# Patient Record
Sex: Female | Born: 1970 | Race: White | Hispanic: No | Marital: Married | Smoking: Never smoker
Health system: Southern US, Community
[De-identification: ages and names within clinical notes are randomized; demographics above are authoritative.]

## PROBLEM LIST (undated history)

## (undated) DIAGNOSIS — E559 Vitamin D deficiency, unspecified: Secondary | ICD-10-CM

## (undated) DIAGNOSIS — E785 Hyperlipidemia, unspecified: Secondary | ICD-10-CM

## (undated) DIAGNOSIS — I10 Essential (primary) hypertension: Secondary | ICD-10-CM

## (undated) DIAGNOSIS — F419 Anxiety disorder, unspecified: Secondary | ICD-10-CM

## (undated) DIAGNOSIS — F411 Generalized anxiety disorder: Secondary | ICD-10-CM

## (undated) DIAGNOSIS — M549 Dorsalgia, unspecified: Secondary | ICD-10-CM

## (undated) DIAGNOSIS — K635 Polyp of colon: Secondary | ICD-10-CM

## (undated) DIAGNOSIS — F32A Depression, unspecified: Secondary | ICD-10-CM

## (undated) HISTORY — DX: Hyperlipidemia, unspecified: E78.5

## (undated) HISTORY — DX: Dorsalgia, unspecified: M54.9

## (undated) HISTORY — DX: Vitamin D deficiency, unspecified: E55.9

## (undated) HISTORY — DX: Anxiety disorder, unspecified: F41.9

## (undated) HISTORY — PX: LAPAROSCOPIC GASTRIC SLEEVE RESECTION: SHX5895

## (undated) HISTORY — DX: Polyp of colon: K63.5

## (undated) HISTORY — PX: ABLATION: SHX5711

## (undated) HISTORY — DX: Depression, unspecified: F32.A

## (undated) HISTORY — DX: Generalized anxiety disorder: F41.1

## (undated) HISTORY — PX: HEEL SPUR RESECTION: SHX6410

---

## 2011-08-15 DIAGNOSIS — L821 Other seborrheic keratosis: Secondary | ICD-10-CM | POA: Insufficient documentation

## 2011-08-15 DIAGNOSIS — D235 Other benign neoplasm of skin of trunk: Secondary | ICD-10-CM | POA: Insufficient documentation

## 2011-09-03 DIAGNOSIS — I83819 Varicose veins of unspecified lower extremities with pain: Secondary | ICD-10-CM | POA: Insufficient documentation

## 2012-03-02 DIAGNOSIS — F32A Depression, unspecified: Secondary | ICD-10-CM | POA: Insufficient documentation

## 2012-03-02 DIAGNOSIS — I10 Essential (primary) hypertension: Secondary | ICD-10-CM | POA: Insufficient documentation

## 2013-12-21 DIAGNOSIS — Z9884 Bariatric surgery status: Secondary | ICD-10-CM | POA: Insufficient documentation

## 2015-09-28 ENCOUNTER — Emergency Department (INDEPENDENT_AMBULATORY_CARE_PROVIDER_SITE_OTHER)
Admission: EM | Admit: 2015-09-28 | Discharge: 2015-09-28 | Disposition: A | Discharge status: Home / Self Care | Payer: BLUE CROSS/BLUE SHIELD | Source: Home / Self Care | Attending: Emergency Medicine | Admitting: Emergency Medicine

## 2015-09-28 ENCOUNTER — Encounter: Payer: Self-pay | Admitting: *Deleted

## 2015-09-28 DIAGNOSIS — H66001 Acute suppurative otitis media without spontaneous rupture of ear drum, right ear: Secondary | ICD-10-CM

## 2015-09-28 HISTORY — DX: Essential (primary) hypertension: I10

## 2015-09-28 MED ORDER — ANTIPYRINE-BENZOCAINE 5.4-1.4 % OT SOLN: 3.0000 [drp] | OTIC | 0 refills | Status: DC | PRN

## 2015-09-28 MED ORDER — IBUPROFEN 200 MG PO TABS: ORAL_TABLET | ORAL | 0 refills | Status: DC

## 2015-09-28 MED ORDER — CEFDINIR 300 MG PO CAPS: 300.0000 mg | ORAL_CAPSULE | Freq: Two times a day (BID) | ORAL | 0 refills | Status: DC

## 2015-09-28 NOTE — ED Triage Notes (Signed)
Pt reports being on Azithromycin x 2-3 days for bronchitis. Today about 2 hours ago developed sudden right ear pain without drainage. Gave 600 mgIBF.

## 2015-09-30 NOTE — ED Provider Notes (Signed)
[facility]    CSN: [id number] Arrival date & time: 09/28/15  1449  First Provider Contact:  None       History   Chief Complaint Chief Complaint  Patient presents with  . Otalgia    HPI Rachael Young is a 45 y.o. female.   HPI 5 days of sinus congestion and mild earache and a cough occasionally productive of slight yellow sputum. She saw her doctor 4 days ago who prescribed Zithromax. She feels the cough has resolved but now complains of severe right earache for 1 day. The right ear pain is sharp and dull. Denies any associated draining or blood from the ear. Denies fever or chills or nausea or vomiting.  Here in urgent care, gave 600 mg ibuprofen by mouth stat, and pain improved somewhat 20 minutes later. Currently denies any chest pain or shortness of breath or cough. She denies chance of pregnancy.  Past Medical History:  Diagnosis Date  . Hypertension     There are no active problems to display for this patient.   Past Surgical History:  Procedure Laterality Date  . CESAREAN SECTION    . LAPAROSCOPIC GASTRIC SLEEVE RESECTION      OB History    No data available       Home Medications    Prior to Admission medications   Medication Sig Start Date End Date Taking? Authorizing Provider  antipyrine-benzocaine Lyla Son) otic solution Place 3-4 drops into the right ear every 2 (two) hours as needed for ear pain. 09/28/15   Lajean Manes, MD  cefdinir (OMNICEF) 300 MG capsule Take 1 capsule (300 mg total) by mouth 2 (two) times daily. X 10 days 09/28/15   Lajean Manes, MD  ibuprofen (ADVIL,MOTRIN) 200 MG tablet Take three tablets ( 600 milligrams total) every 6 with food as needed for pain. 09/28/15   Lajean Manes, MD    Family History Family History  Problem Relation Age of Onset  . Hypertension Mother   . Hypertension Father     Social History Social History  Substance Use Topics  . Smoking status: Never Smoker  . Smokeless tobacco: Never Used    . Alcohol use No     Allergies   Review of patient's allergies indicates no known allergies.   Review of Systems Review of Systems  All other systems reviewed and are negative.    Physical Exam Triage Vital Signs ED Triage Vitals  Enc Vitals Group     BP 09/28/15 1544 (!) 156/107     Pulse Rate 09/28/15 1544 92     Resp 09/28/15 1544 14     Temp 09/28/15 1544 98.2 F (36.8 C)     Temp Source 09/28/15 1544 Oral     SpO2 09/28/15 1544 100 %     Weight 09/28/15 1544 208 lb (94.3 kg)     Height 09/28/15 1544  (1.575 m)     Head Circumference --      Peak Flow --      Pain Score 09/28/15 1546 8     Pain Loc --      Pain Edu? --      Excl. in GC? --    No data found.   Updated Vital Signs BP (!) 156/107 (BP Location: Left Arm)   Pulse 92   Temp 98.2 F (36.8 C) (Oral)   Resp 14   Ht  (1.575 m)   Wt 208 lb (94.3 kg)   LMP 08/10/2015  SpO2 100%   BMI 38.04 kg/m   Visual Acuity Right Eye Distance:   Left Eye Distance:   Bilateral Distance:    Right Eye Near:   Left Eye Near:    Bilateral Near:     Physical Exam  Constitutional: She is oriented to person, place, and time. She appears well-developed and well-nourished. No distress.  HENT:  Head: Normocephalic and atraumatic.  Right Ear: External ear and ear canal normal. Tympanic membrane is injected and erythematous.  Left Ear: Tympanic membrane, external ear and ear canal normal.  Nose: Mucosal edema and rhinorrhea present.  Mouth/Throat: Oropharynx is clear and moist. No oral lesions.  Eyes: Conjunctivae are normal. No scleral icterus.  Neck: Neck supple.  Cardiovascular: Normal rate, regular rhythm and normal heart sounds.   Pulmonary/Chest: Effort normal and breath sounds normal.  Lymphadenopathy:    She has no cervical adenopathy.  Neurological: She is alert and oriented to person, place, and time.  Skin: Skin is warm and dry.  Psychiatric: She has a normal mood and affect.  Nursing  note and vitals reviewed.    UC Treatments / Results  Labs (all labs ordered are listed, but only abnormal results are displayed) Labs Reviewed - No data to display  EKG  EKG Interpretation None       Radiology No results found.  Procedures Procedures (including critical care time)  Medications Ordered in UC Medications - No data to display   Initial Impression / Assessment and Plan / UC Course  I have reviewed the triage vital signs and the nursing notes.  Pertinent labs & imaging results that were available during my care of the patient were reviewed by me and considered in my medical decision making (see chart for details).  Clinical Course    Final Clinical Impressions(s) / UC Diagnoses   Final diagnoses:  Acute suppurative otitis media of right ear without spontaneous rupture of tympanic membrane, recurrence not specified   Treatment options discussed, as well as risks, benefits, alternatives. Patient voiced understanding and agreement with the following plans:  New Prescriptions Discharge Medication List as of 09/28/2015  4:00 PM    START taking these medications   Details  antipyrine-benzocaine (AURALGAN) otic solution Place 3-4 drops into the right ear every 2 (two) hours as needed for ear pain., Starting Fri 09/28/2015, Normal    cefdinir (OMNICEF) 300 MG capsule Take 1 capsule (300 mg total) by mouth 2 (two) times daily. X 10 days, Starting Fri 09/28/2015, Normal    ibuprofen (ADVIL,MOTRIN) 200 MG tablet Take three tablets ( 600 milligrams total) every 6 with food as needed for pain., No Print      Other symptomatic care discussed Follow-up with your primary care doctor in 5-7 days if not improving, or sooner if symptoms become worse. Precautions discussed. Red flags discussed. Questions invited and answered. Patient voiced understanding and agreement.    Lajean Manesavid Massey, MD 09/30/15 [phone]

## 2015-10-12 DIAGNOSIS — H6120 Impacted cerumen, unspecified ear: Secondary | ICD-10-CM | POA: Insufficient documentation

## 2015-10-12 DIAGNOSIS — H9313 Tinnitus, bilateral: Secondary | ICD-10-CM | POA: Insufficient documentation

## 2015-10-12 DIAGNOSIS — H903 Sensorineural hearing loss, bilateral: Secondary | ICD-10-CM | POA: Insufficient documentation

## 2017-01-12 ENCOUNTER — Emergency Department (INDEPENDENT_AMBULATORY_CARE_PROVIDER_SITE_OTHER): Payer: BLUE CROSS/BLUE SHIELD

## 2017-01-12 ENCOUNTER — Other Ambulatory Visit: Payer: Self-pay

## 2017-01-12 ENCOUNTER — Encounter: Payer: Self-pay | Admitting: Emergency Medicine

## 2017-01-12 ENCOUNTER — Emergency Department (INDEPENDENT_AMBULATORY_CARE_PROVIDER_SITE_OTHER)
Admission: EM | Admit: 2017-01-12 | Discharge: 2017-01-12 | Disposition: A | Discharge status: Home / Self Care | Payer: BLUE CROSS/BLUE SHIELD | Source: Home / Self Care | Attending: Family Medicine | Admitting: Family Medicine

## 2017-01-12 DIAGNOSIS — M25572 Pain in left ankle and joints of left foot: Secondary | ICD-10-CM

## 2017-01-12 DIAGNOSIS — M779 Enthesopathy, unspecified: Secondary | ICD-10-CM

## 2017-01-12 DIAGNOSIS — M778 Other enthesopathies, not elsewhere classified: Secondary | ICD-10-CM

## 2017-01-12 MED ORDER — PREDNISONE 20 MG PO TABS
ORAL_TABLET | ORAL | 0 refills | Status: DC
Start: 2017-01-12 — End: 2017-03-10

## 2017-01-12 NOTE — ED Provider Notes (Signed)
[facility]    CSN: [id number] Arrival date & time: 01/12/17  1148     History   Chief Complaint Chief Complaint  Patient presents with  . Foot Pain    HPI Rachael Young is a 46 y.o. female.   Patient complains of intermittent pain in her left ankle/foot for about 6 months, becoming worse during the past 3 months.  She recalls no injury, and denies changes in activities.  She has had intermittent swelling in her foot.   The history is provided by the patient.  Foot Pain  This is a chronic problem. Episode onset: 6 months. The problem occurs daily. The problem has been gradually worsening. The symptoms are aggravated by walking. Nothing relieves the symptoms. She has tried nothing for the symptoms.    Past Medical History:  Diagnosis Date  . Hypertension     There are no active problems to display for this patient.   Past Surgical History:  Procedure Laterality Date  . CESAREAN SECTION    . LAPAROSCOPIC GASTRIC SLEEVE RESECTION      OB History    No data available       Home Medications    Prior to Admission medications   Medication Sig Start Date End Date Taking? Authorizing Provider  FLUoxetine (PROZAC) 20 MG capsule Take 20 mg daily by mouth.   Yes [provider]  hydrochlorothiazide (MICROZIDE) 12.5 MG capsule Take 12.5 mg daily by mouth.   Yes [provider]  ibuprofen (ADVIL,MOTRIN) 200 MG tablet Take three tablets ( 600 milligrams total) every 6 with food as needed for pain. 09/28/15   Lajean ManesMassey, David, MD  predniSONE (DELTASONE) 20 MG tablet Take one tab by mouth twice daily for 4 days, then one daily for 3 days. Take with food. 01/12/17   Lattie HawBeese, Tresa Jolley A, MD    Family History Family History  Problem Relation Age of Onset  . Hypertension Mother   . Hypertension Father     Social History Social History   Tobacco Use  . Smoking status: Never Smoker  . Smokeless tobacco: Never Used  Substance Use Topics  . Alcohol  use: No  . Drug use: No     Allergies   Patient has no known allergies.   Review of Systems Review of Systems  All other systems reviewed and are negative.    Physical Exam Triage Vital Signs ED Triage Vitals  Enc Vitals Group     BP 01/12/17 1229 (!) 142/89     Pulse Rate 01/12/17 1229 71     Resp --      Temp 01/12/17 1229 98.6 F (37 C)     Temp Source 01/12/17 1229 Oral     SpO2 01/12/17 1229 97 %     Weight 01/12/17 1230 226 lb (102.5 kg)     Height 01/12/17 1230 5\' 3"  (1.6 m)     Head Circumference --      Peak Flow --      Pain Score 01/12/17 1230 5     Pain Loc --      Pain Edu? --      Excl. in GC? --    No data found.  Updated Vital Signs BP (!) 142/89 (BP Location: Right Arm)   Pulse 71   Temp 98.6 F (37 C) (Oral)   Ht 5\' 3"  (1.6 m)   Wt 226 lb (102.5 kg)   SpO2 97%   BMI 40.03 kg/m   Visual  Acuity Right Eye Distance:   Left Eye Distance:   Bilateral Distance:    Right Eye Near:   Left Eye Near:    Bilateral Near:     Physical Exam  Constitutional: She appears well-developed and well-nourished. No distress.  HENT:  Head: Normocephalic.  Eyes: Pupils are equal, round, and reactive to light.  Cardiovascular: Normal rate.  Pulmonary/Chest: Effort normal.  Musculoskeletal:       Left foot: There is tenderness and swelling. There is no bony tenderness, normal capillary refill, no crepitus and no deformity.       Feet:  Left foot reveals distinct tenderness over extensor tendons as noted on diagram.   Neurological: She is alert.  Skin: Skin is warm and dry.  Nursing note and vitals reviewed.    UC Treatments / Results  Labs (all labs ordered are listed, but only abnormal results are displayed) Labs Reviewed - No data to display  EKG  EKG Interpretation None       Radiology Dg Ankle Complete Left  Result Date: 01/12/2017 CLINICAL DATA:  Pain across LEFT ankle and to top of LEFT foot since decorating yesterday EXAM: LEFT  ANKLE COMPLETE - 3+ VIEW COMPARISON:  None FINDINGS: Osseous mineralization normal for technique. Joint spaces preserved. Small plantar calcaneal spur. No acute fracture, dislocation, or bone destruction. Question mild anterior soft tissue swelling at ankle. IMPRESSION: No acute osseous abnormalities. Electronically Signed   By: Ulyses SouthwardMark  Boles M.D.   On: 01/12/2017 13:00   Dg Foot Complete Left  Result Date: 01/12/2017 CLINICAL DATA:  Pain across LEFT ankle and to top of LEFT foot since decorating yesterday EXAM: LEFT FOOT - COMPLETE 3+ VIEW COMPARISON:  None. FINDINGS: Osseous mineralization normal for technique. Joint spaces preserved. Small plantar calcaneal spur. No acute fracture, dislocation or bone destruction. IMPRESSION: No acute osseous abnormalities. Electronically Signed   By: Ulyses SouthwardMark  Boles M.D.   On: 01/12/2017 12:57    Procedures Procedures (including critical care time)  Medications Ordered in UC Medications - No data to display   Initial Impression / Assessment and Plan / UC Course  I have reviewed the triage vital signs and the nursing notes.  Pertinent labs & imaging results that were available during my care of the patient were reviewed by me and considered in my medical decision making (see chart for details).    Begin prednisone burst/taper. Apply ice pack for 20 minutes, 3 to 4 times daily  Continue until pain and swelling decrease.  Begin calf stretching exercises. Followup with Dr. Rodney Langtonhomas Thekkekandam or Dr. Clementeen GrahamEvan [facility] ([facility]) if not improving about three weeks     Final Clinical Impressions(s) / UC Diagnoses   Final diagnoses:  Pain of joint of left ankle and foot  Extensor tendonitis of foot    ED Discharge Orders        Ordered    predniSONE (DELTASONE) 20 MG tablet     01/12/17 1324          Lattie HawBeese, Beckhem Isadore A, MD 01/15/17 1344

## 2017-01-12 NOTE — Discharge Instructions (Signed)
Apply ice pack for 20 minutes, 3 to 4 times daily  Continue until pain and swelling decrease.  Begin calf stretching exercises.

## 2017-01-12 NOTE — ED Triage Notes (Signed)
Left foot/ankle pain x 6 months, intermittent swelling and pain 5-8

## 2017-03-10 ENCOUNTER — Emergency Department (INDEPENDENT_AMBULATORY_CARE_PROVIDER_SITE_OTHER)
Admission: EM | Admit: 2017-03-10 | Discharge: 2017-03-10 | Disposition: A | Discharge status: Home / Self Care | Payer: BLUE CROSS/BLUE SHIELD | Source: Home / Self Care | Attending: Family Medicine | Admitting: Family Medicine

## 2017-03-10 ENCOUNTER — Other Ambulatory Visit: Payer: Self-pay

## 2017-03-10 ENCOUNTER — Encounter: Payer: Self-pay | Admitting: *Deleted

## 2017-03-10 DIAGNOSIS — J40 Bronchitis, not specified as acute or chronic: Secondary | ICD-10-CM | POA: Diagnosis not present

## 2017-03-10 MED ORDER — DOXYCYCLINE HYCLATE 100 MG PO CAPS: 100.0000 mg | ORAL_CAPSULE | Freq: Two times a day (BID) | ORAL | 0 refills | Status: DC

## 2017-03-10 NOTE — ED Triage Notes (Signed)
Pt c/o cough x 1 wk; productive at times, but mostly nonproductive. Denies fever. She has taken Mucinex DM.

## 2017-03-10 NOTE — ED Provider Notes (Signed)
[facility]    CSN: [id number] Arrival date & time: 03/10/17  1547     History   Chief Complaint Chief Complaint  Patient presents with  . Cough    HPI Rachael Young is a 47 y.o. female.   About 8 days ago patient developed a low grade fever, fatigue, and non-productive cough worse at night.  No sore throat or nasal congestion.  She had fever during the first 3 days of her illness, now resolved, but still has sweats.  She complains of tightness in her anterior chest.  She often coughs until she gags.  She does not recall her last Tdap. She has a past history of pneumonia.   The history is provided by the patient.    Past Medical History:  Diagnosis Date  . Hypertension     There are no active problems to display for this patient.   Past Surgical History:  Procedure Laterality Date  . CESAREAN SECTION    . LAPAROSCOPIC GASTRIC SLEEVE RESECTION      OB History    No data available       Home Medications    Prior to Admission medications   Medication Sig Start Date End Date Taking? Authorizing Provider  doxycycline (VIBRAMYCIN) 100 MG capsule Take 1 capsule (100 mg total) by mouth 2 (two) times daily. Take with food. 03/10/17   Lattie HawBeese, Doshia Dalia A, MD  FLUoxetine (PROZAC) 20 MG capsule Take 20 mg daily by mouth.    [provider]  hydrochlorothiazide (MICROZIDE) 12.5 MG capsule Take 12.5 mg daily by mouth.    [provider]  ibuprofen (ADVIL,MOTRIN) 200 MG tablet Take three tablets ( 600 milligrams total) every 6 with food as needed for pain. 09/28/15   Lajean ManesMassey, David, MD    Family History Family History  Problem Relation Age of Onset  . Hypertension Mother   . Hypertension Father     Social History Social History   Tobacco Use  . Smoking status: Never Smoker  . Smokeless tobacco: Never Used  Substance Use Topics  . Alcohol use: No  . Drug use: No     Allergies   Patient has no known allergies.   Review of  Systems Review of Systems No sore throat + cough No pleuritic pain, but feels tight in anterior chest No wheezing Minimal nasal congestion No post-nasal drainage No sinus pain/pressure No itchy/red eyes No earache No hemoptysis No SOB No fever/chills No nausea No vomiting No abdominal pain No diarrhea No urinary symptoms No skin rash + fatigue No myalgias No headache Used OTC meds without relief   Physical Exam Triage Vital Signs ED Triage Vitals  Enc Vitals Group     BP 03/10/17 1605 (!) 158/102     Pulse Rate 03/10/17 1605 81     Resp 03/10/17 1605 16     Temp 03/10/17 1605 98 F (36.7 C)     Temp Source 03/10/17 1605 Oral     SpO2 03/10/17 1605 97 %     Weight 03/10/17 1605 233 lb (105.7 kg)     Height 03/10/17 1605 5\' 2"  (1.575 m)     Head Circumference --      Peak Flow --      Pain Score 03/10/17 1606 0     Pain Loc --      Pain Edu? --      Excl. in GC? --    No data found.  Updated Vital Signs BP Marland Kitchen(!)  158/102 (BP Location: Right Arm)   Pulse 81   Temp 98 F (36.7 C) (Oral)   Resp 16   Ht 5\' 2"  (1.575 m)   Wt 233 lb (105.7 kg)   SpO2 97%   BMI 42.62 kg/m   Visual Acuity Right Eye Distance:   Left Eye Distance:   Bilateral Distance:    Right Eye Near:   Left Eye Near:    Bilateral Near:     Physical Exam Nursing notes and Vital Signs reviewed. Appearance:  Patient appears stated age, and in no acute distress Eyes:  Pupils are equal, round, and reactive to light and accomodation.  Extraocular movement is intact.  Conjunctivae are not inflamed  Ears:  Canals normal.  Tympanic membranes normal.  Nose:  Mildly congested turbinates.  No sinus tenderness.   Pharynx:  Normal Neck:  Supple.  Enlarged nontender posterior/lateral nodes are palpated bilaterally  Lungs:  Clear to auscultation.  Breath sounds are equal.  Moving air well. Chest:  Distinct tenderness to palpation over the mid-sternum.  Heart:  Regular rate and rhythm without  murmurs, rubs, or gallops.  Abdomen:  Nontender without masses or hepatosplenomegaly.  Bowel sounds are present.  No CVA or flank tenderness.  Extremities:  No edema.  Skin:  No rash present.    UC Treatments / Results  Labs (all labs ordered are listed, but only abnormal results are displayed) Labs Reviewed - No data to display  EKG  EKG Interpretation None       Radiology No results found.  Procedures Procedures (including critical care time)  Medications Ordered in UC Medications - No data to display   Initial Impression / Assessment and Plan / UC Course  I have reviewed the triage vital signs and the nursing notes.  Pertinent labs & imaging results that were available during my care of the patient were reviewed by me and considered in my medical decision making (see chart for details).    Begin doxycycline 100mg  BID for atypical coverage. Take plain guaifenesin (1200mg  extended release tabs such as Mucinex) twice daily, with plenty of water, for cough and congestion.  Get adequate rest.   May use Afrin nasal spray (or generic oxymetazoline) each morning for about 5 days and then discontinue.  Also recommend using saline nasal spray several times daily and saline nasal irrigation (AYR is a common brand).   May take Delsym Cough Suppressant at bedtime for nighttime cough.  Stop all antihistamines for now, and other non-prescription cough/cold preparations. Recommend a Tdap when well.     Final Clinical Impressions(s) / UC Diagnoses   Final diagnoses:  Bronchitis    ED Discharge Orders        Ordered    doxycycline (VIBRAMYCIN) 100 MG capsule  2 times daily     03/10/17 1616          Lattie Haw, MD 03/10/17 [phone]

## 2017-03-10 NOTE — Discharge Instructions (Signed)
Take plain guaifenesin (1200mg  extended release tabs such as Mucinex) twice daily, with plenty of water, for cough and congestion.  Get adequate rest.   May use Afrin nasal spray (or generic oxymetazoline) each morning for about 5 days and then discontinue.  Also recommend using saline nasal spray several times daily and saline nasal irrigation (AYR is a common brand).   May take Delsym Cough Suppressant at bedtime for nighttime cough.  Stop all antihistamines for now, and other non-prescription cough/cold preparations. Recommend a Tdap when well.

## 2017-03-23 ENCOUNTER — Other Ambulatory Visit: Payer: Self-pay

## 2017-03-23 ENCOUNTER — Emergency Department (INDEPENDENT_AMBULATORY_CARE_PROVIDER_SITE_OTHER): Payer: BLUE CROSS/BLUE SHIELD

## 2017-03-23 ENCOUNTER — Encounter: Payer: Self-pay | Admitting: *Deleted

## 2017-03-23 ENCOUNTER — Emergency Department (INDEPENDENT_AMBULATORY_CARE_PROVIDER_SITE_OTHER)
Admission: EM | Admit: 2017-03-23 | Discharge: 2017-03-23 | Disposition: A | Discharge status: Home / Self Care | Payer: BLUE CROSS/BLUE SHIELD | Source: Home / Self Care | Attending: Family Medicine | Admitting: Family Medicine

## 2017-03-23 DIAGNOSIS — R05 Cough: Secondary | ICD-10-CM

## 2017-03-23 DIAGNOSIS — R053 Chronic cough: Secondary | ICD-10-CM

## 2017-03-23 DIAGNOSIS — R5383 Other fatigue: Secondary | ICD-10-CM | POA: Diagnosis not present

## 2017-03-23 MED ORDER — ALBUTEROL SULFATE HFA 108 (90 BASE) MCG/ACT IN AERS: 1.0000 | INHALATION_SPRAY | Freq: Four times a day (QID) | RESPIRATORY_TRACT | 0 refills | Status: DC | PRN

## 2017-03-23 MED ORDER — PREDNISONE 20 MG PO TABS: ORAL_TABLET | ORAL | 0 refills | Status: DC

## 2017-03-23 NOTE — ED Provider Notes (Signed)
[facility]    CSN: [id number] Arrival date & time: 03/23/17  1322     History   Chief Complaint Chief Complaint  Patient presents with  . Cough    HPI Rachael Young is a 47 y.o. female.   HPI  Rachael Young is a 47 y.o. female presenting to UC with c/o nonproductive cough that started over 3 weeks ago.  She was seen at [facility] on 03/10/17, completed a course of doxycycline last week and has been taking Delsym but cough has persisted.  Mild chest and throat soreness when she cannot stop coughing.  Denies chest pain or SOB at this time.  Denies fever, chills, n/v/d. No recent travel. No hx of asthma but she has had bronchitis and needed prednisone and inhaler in the past.     Past Medical History:  Diagnosis Date  . Hypertension     There are no active problems to display for this patient.   Past Surgical History:  Procedure Laterality Date  . CESAREAN SECTION    . LAPAROSCOPIC GASTRIC SLEEVE RESECTION      OB History    No data available       Home Medications    Prior to Admission medications   Medication Sig Start Date End Date Taking? Authorizing Provider  albuterol (PROVENTIL HFA;VENTOLIN HFA) 108 (90 Base) MCG/ACT inhaler Inhale 1-2 puffs into the lungs every 6 (six) hours as needed for wheezing or shortness of breath. 03/23/17   Lurene Shadow, PA-C  FLUoxetine (PROZAC) 20 MG capsule Take 20 mg daily by mouth.    [provider]  hydrochlorothiazide (MICROZIDE) 12.5 MG capsule Take 12.5 mg daily by mouth.    [provider]  ibuprofen (ADVIL,MOTRIN) 200 MG tablet Take three tablets ( 600 milligrams total) every 6 with food as needed for pain. 09/28/15   Lajean Manes, MD  predniSONE (DELTASONE) 20 MG tablet 3 tabs po day one, then 2 po daily x 4 days 03/23/17   Lurene Shadow, PA-C    Family History Family History  Problem Relation Age of Onset  . Hypertension Mother   . Hypertension Father     Social History Social History    Tobacco Use  . Smoking status: Never Smoker  . Smokeless tobacco: Never Used  Substance Use Topics  . Alcohol use: No  . Drug use: No     Allergies   Patient has no known allergies.   Review of Systems Review of Systems  Constitutional: Negative for chills and fever.  HENT: Positive for congestion ( minimal) and sore throat ( mild). Negative for ear pain, trouble swallowing and voice change.   Respiratory: Positive for cough. Negative for shortness of breath.   Cardiovascular: Positive for chest pain (minimal with cough). Negative for palpitations.  Gastrointestinal: Negative for abdominal pain, diarrhea, nausea and vomiting.  Musculoskeletal: Negative for arthralgias, back pain and myalgias.  Skin: Negative for rash.     Physical Exam Triage Vital Signs ED Triage Vitals [03/23/17 1348]  Enc Vitals Group     BP (!) 142/90     Pulse Rate 80     Resp 18     Temp 98.6 F (37 C)     Temp Source Oral     SpO2 99 %     Weight 234 lb (106.1 kg)     Height 5\' 2"  (1.575 m)     Head Circumference      Peak Flow  Pain Score 0     Pain Loc      Pain Edu?      Excl. in GC?    No data found.  Updated Vital Signs BP (!) 142/90 (BP Location: Right Arm)   Pulse 80   Temp 98.6 F (37 C) (Oral)   Resp 18   Ht 5\' 2"  (1.575 m)   Wt 234 lb (106.1 kg)   SpO2 99%   BMI 42.80 kg/m   Visual Acuity Right Eye Distance:   Left Eye Distance:   Bilateral Distance:    Right Eye Near:   Left Eye Near:    Bilateral Near:     Physical Exam  Constitutional: She is oriented to person, place, and time. She appears well-developed and well-nourished. No distress.  HENT:  Head: Normocephalic and atraumatic.  Right Ear: Tympanic membrane normal.  Left Ear: Tympanic membrane normal.  Nose: Nose normal. Right sinus exhibits no maxillary sinus tenderness and no frontal sinus tenderness. Left sinus exhibits no maxillary sinus tenderness and no frontal sinus tenderness.   Mouth/Throat: Uvula is midline, oropharynx is clear and moist and mucous membranes are normal.  Eyes: EOM are normal.  Neck: Normal range of motion. Neck supple.  Cardiovascular: Normal rate and regular rhythm.  Pulmonary/Chest: Effort normal and breath sounds normal. No stridor. No respiratory distress. She has no wheezes. She has no rales.  Musculoskeletal: Normal range of motion.  Lymphadenopathy:    She has no cervical adenopathy.  Neurological: She is alert and oriented to person, place, and time.  Skin: Skin is warm and dry. She is not diaphoretic.  Psychiatric: She has a normal mood and affect. Her behavior is normal.  Nursing note and vitals reviewed.    UC Treatments / Results  Labs (all labs ordered are listed, but only abnormal results are displayed) Labs Reviewed - No data to display  EKG  EKG Interpretation None       Radiology Dg Chest 2 View  Result Date: 03/23/2017 CLINICAL DATA:  Cough for 3 weeks.  Fatigue. EXAM: CHEST  2 VIEW COMPARISON:  None. FINDINGS: The heart size and mediastinal contours are within normal limits. Both lungs are clear. The visualized skeletal structures are unremarkable. IMPRESSION: No active cardiopulmonary disease. Electronically Signed   By: Norva Pavlov M.D.   On: 03/23/2017 14:39    Procedures Procedures (including critical care time)  Medications Ordered in UC Medications - No data to display   Initial Impression / Assessment and Plan / UC Course  I have reviewed the triage vital signs and the nursing notes.  Pertinent labs & imaging results that were available during my care of the patient were reviewed by me and considered in my medical decision making (see chart for details).     CXR: WNL Vitals including HR and O2 Sat WNL  Will tx with prednisone and albuterol Pt declined tessalon Encouraged f/u with PCP in 1 week if not improving.   Final Clinical Impressions(s) / UC Diagnoses   Final diagnoses:   Persistent cough for 3 weeks or longer    ED Discharge Orders        Ordered    albuterol (PROVENTIL HFA;VENTOLIN HFA) 108 (90 Base) MCG/ACT inhaler  Every 6 hours PRN     03/23/17 1449    predniSONE (DELTASONE) 20 MG tablet     03/23/17 1449       Controlled Substance Prescriptions [place] Controlled Substance Registry consulted? Not Applicable   Waylan Rocher  O, PA-C 03/23/17 1548

## 2017-03-23 NOTE — ED Triage Notes (Signed)
Pt reports that her nonproductive cough is not resolving after taking ABT from her visit on 03/10/17. She has taken Delsym with minimal relief. Denies fever.

## 2019-07-15 IMAGING — DX DG FOOT COMPLETE 3+V*L*
3 series · 3 of 3 positions shown · non-contrast
Comparison: None.

CLINICAL DATA: Pain across LEFT ankle and to top of LEFT foot since
decorating yesterday

EXAM:
LEFT FOOT - COMPLETE 3+ VIEW

[foot ap]
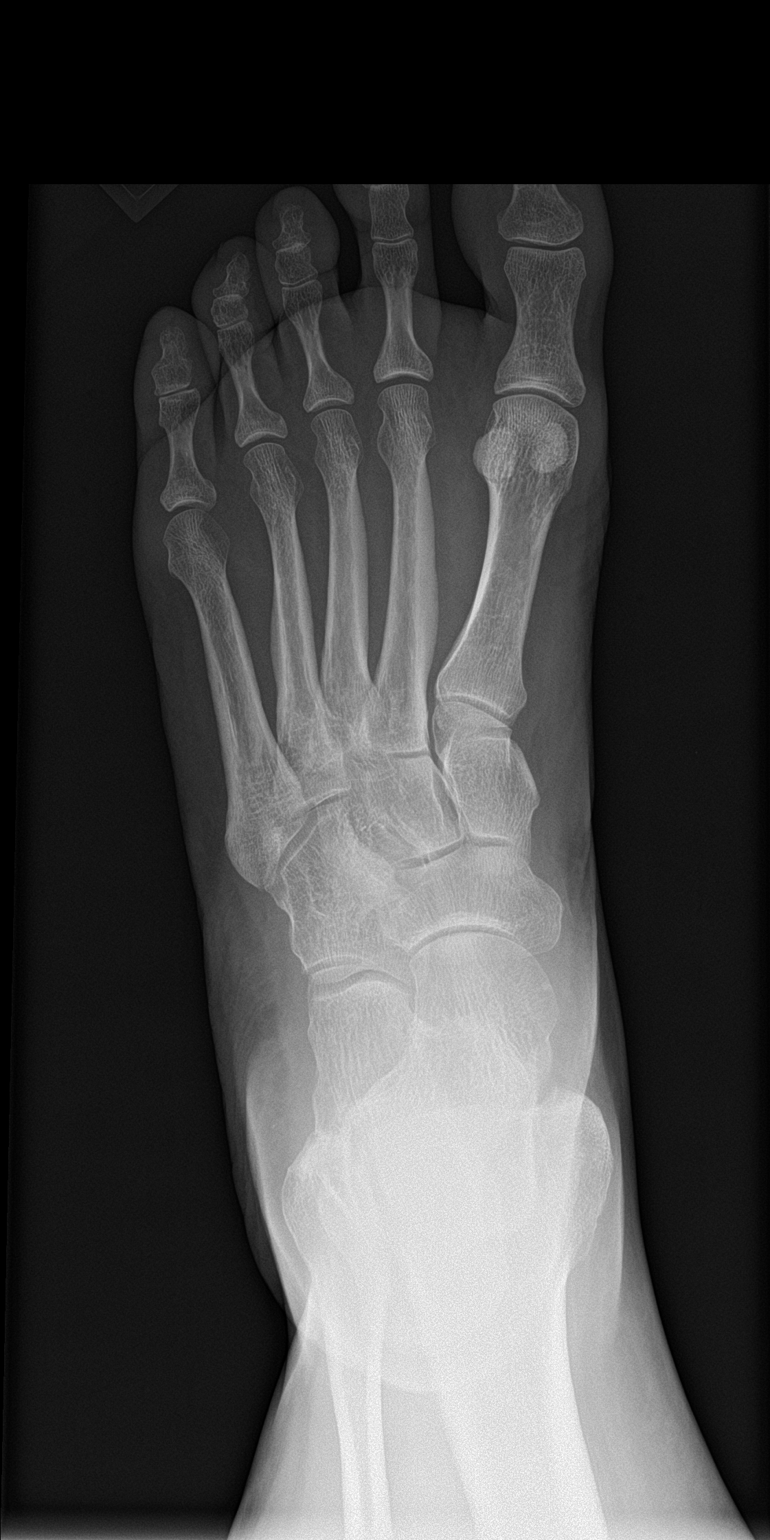

[foot obl]
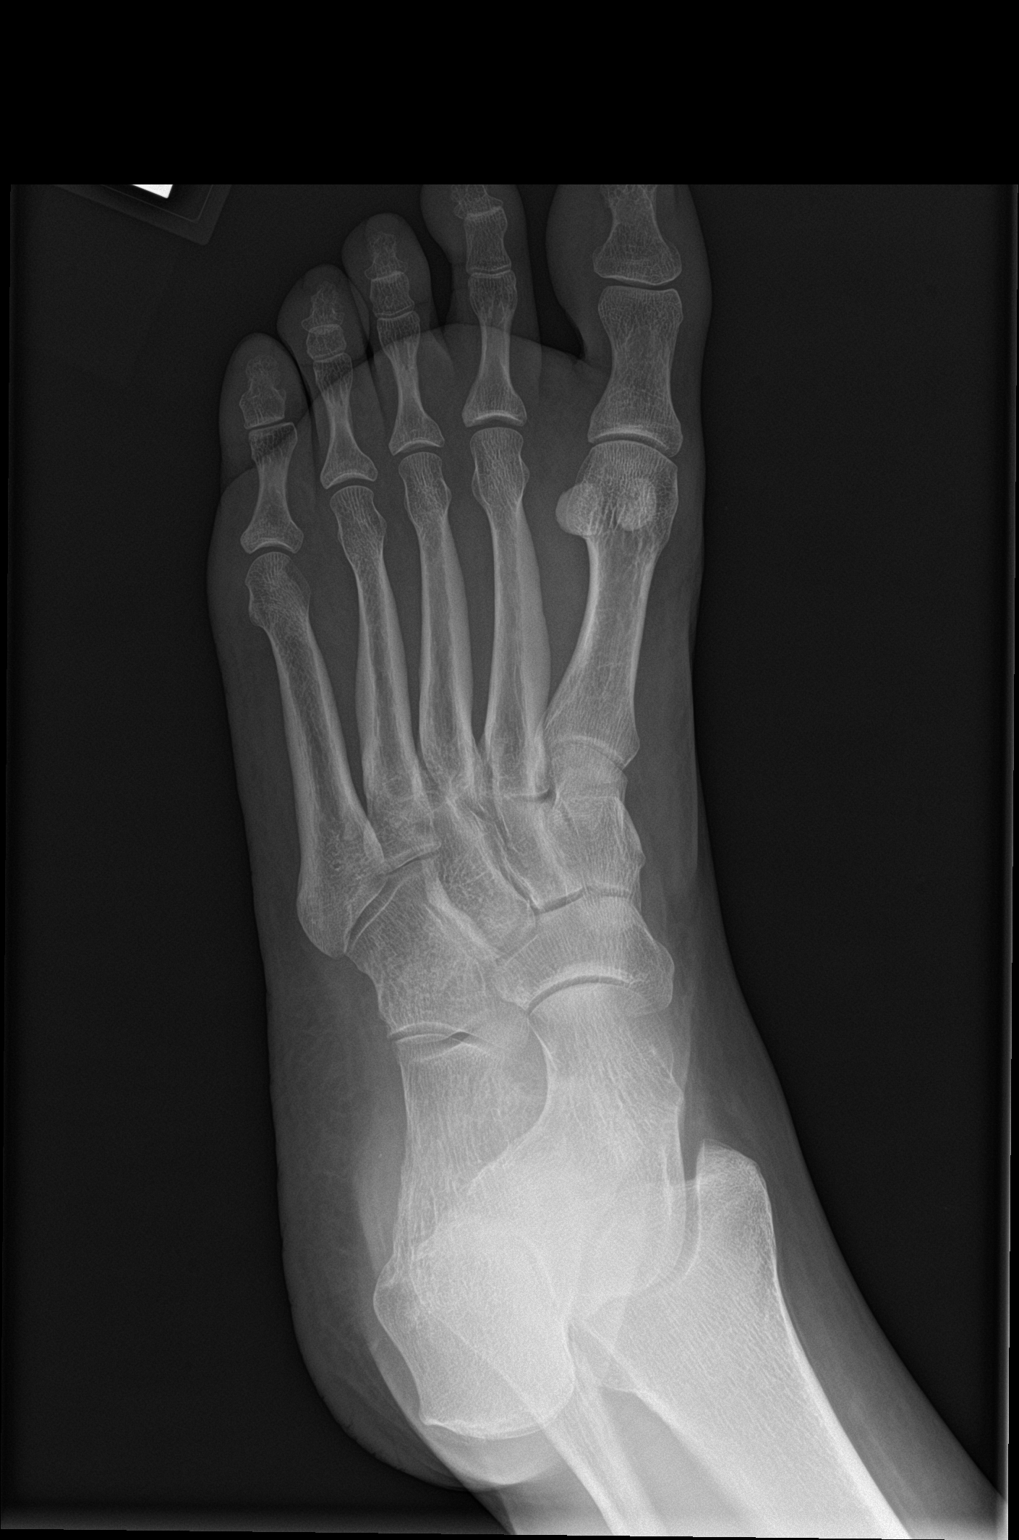

[foot lat]
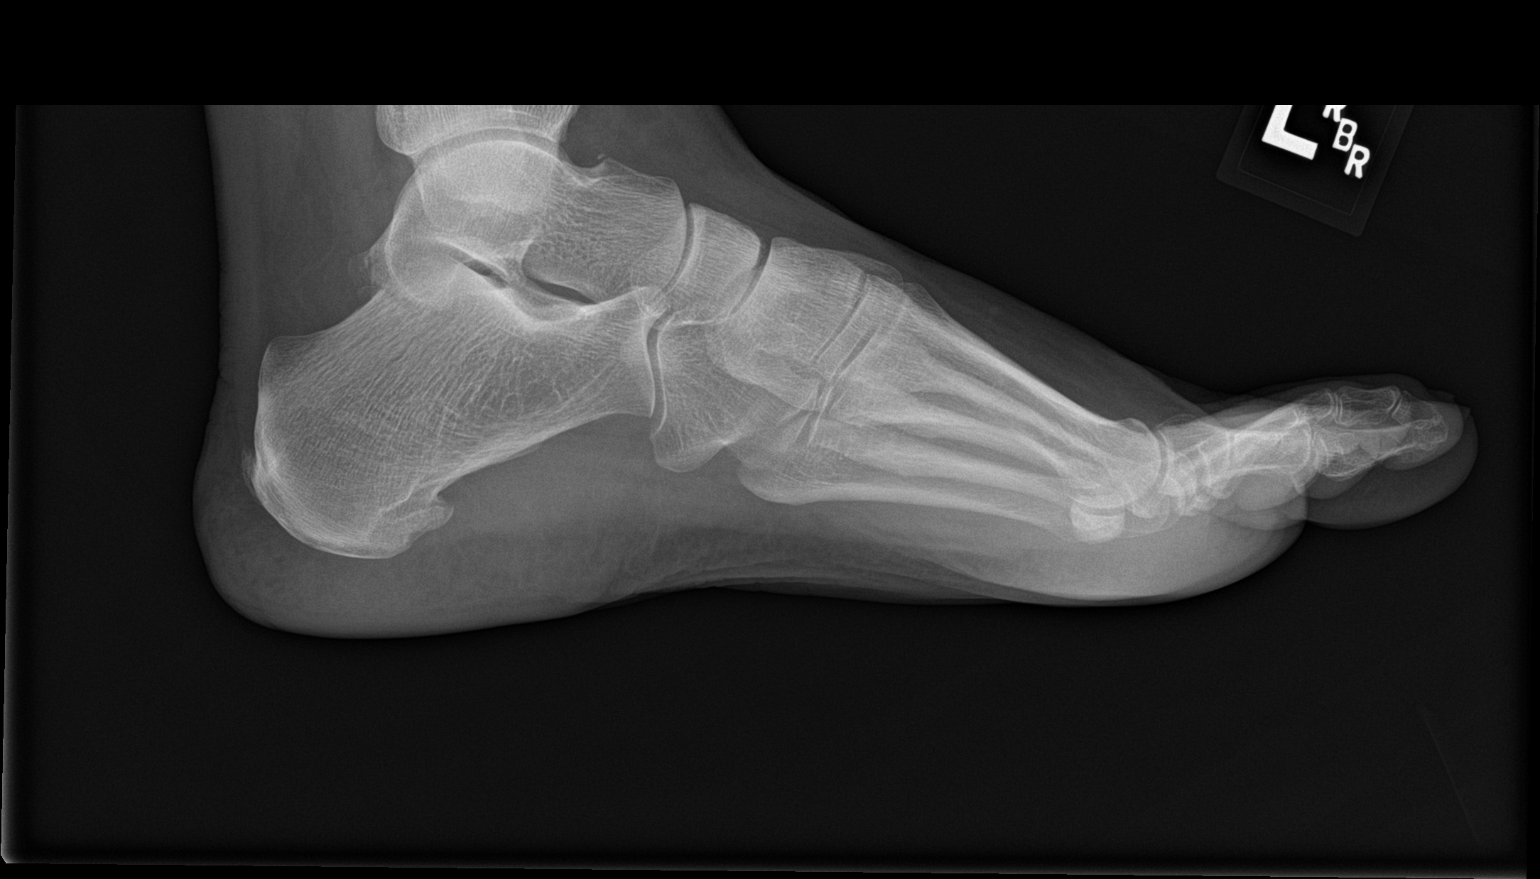

[3 of 3 positions shown; findings below may reference images not displayed]

FINDINGS: Osseous mineralization normal for technique.

Joint spaces preserved.

Small plantar calcaneal spur.

No acute fracture, dislocation or bone destruction.
IMPRESSION: No acute osseous abnormalities.

## 2019-09-23 IMAGING — DX DG CHEST 2V
2 series · 2 of 2 positions shown · non-contrast
Comparison: None.

CLINICAL DATA: Cough for 3 weeks.  Fatigue.

EXAM:
CHEST  2 VIEW

[chest pa]
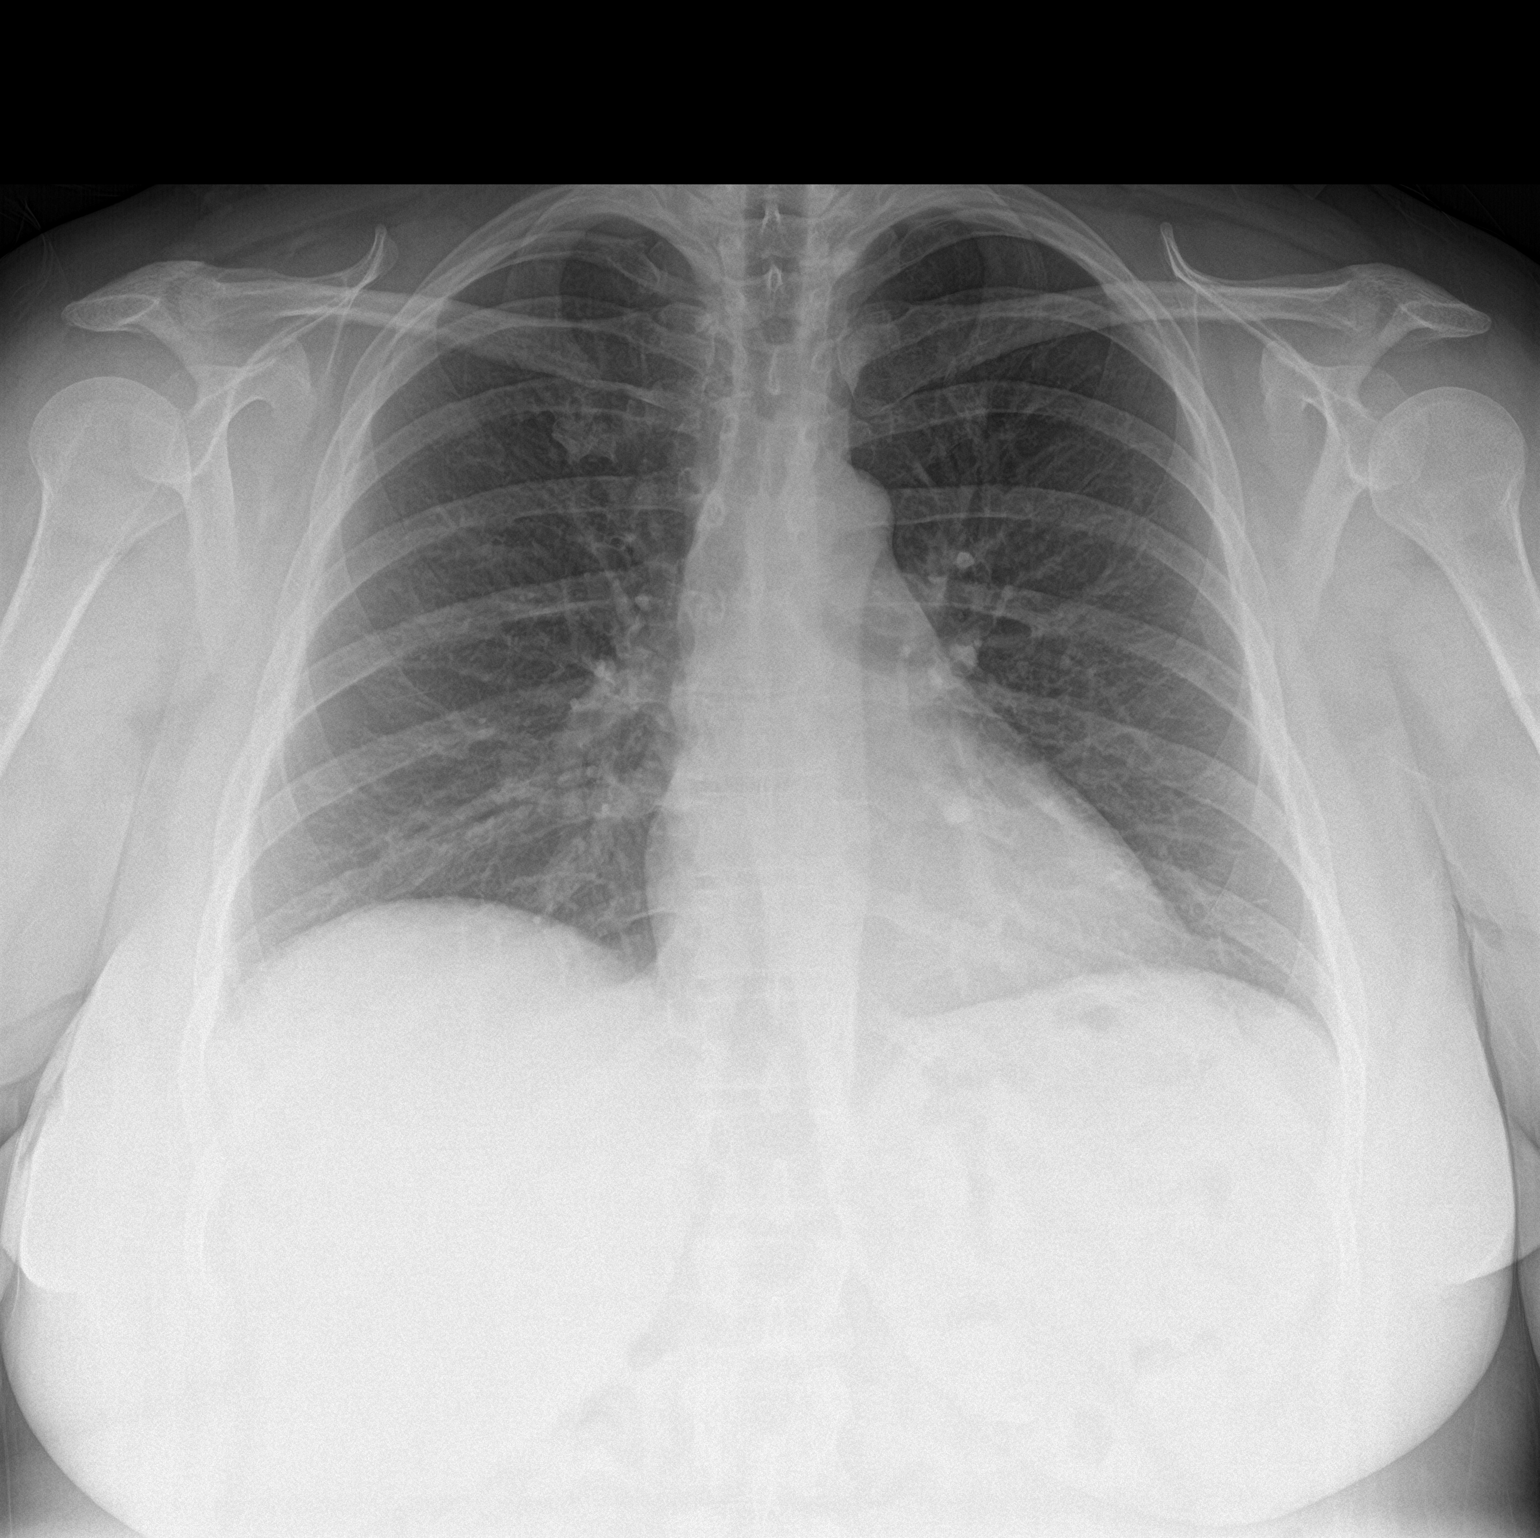

[chest lat]
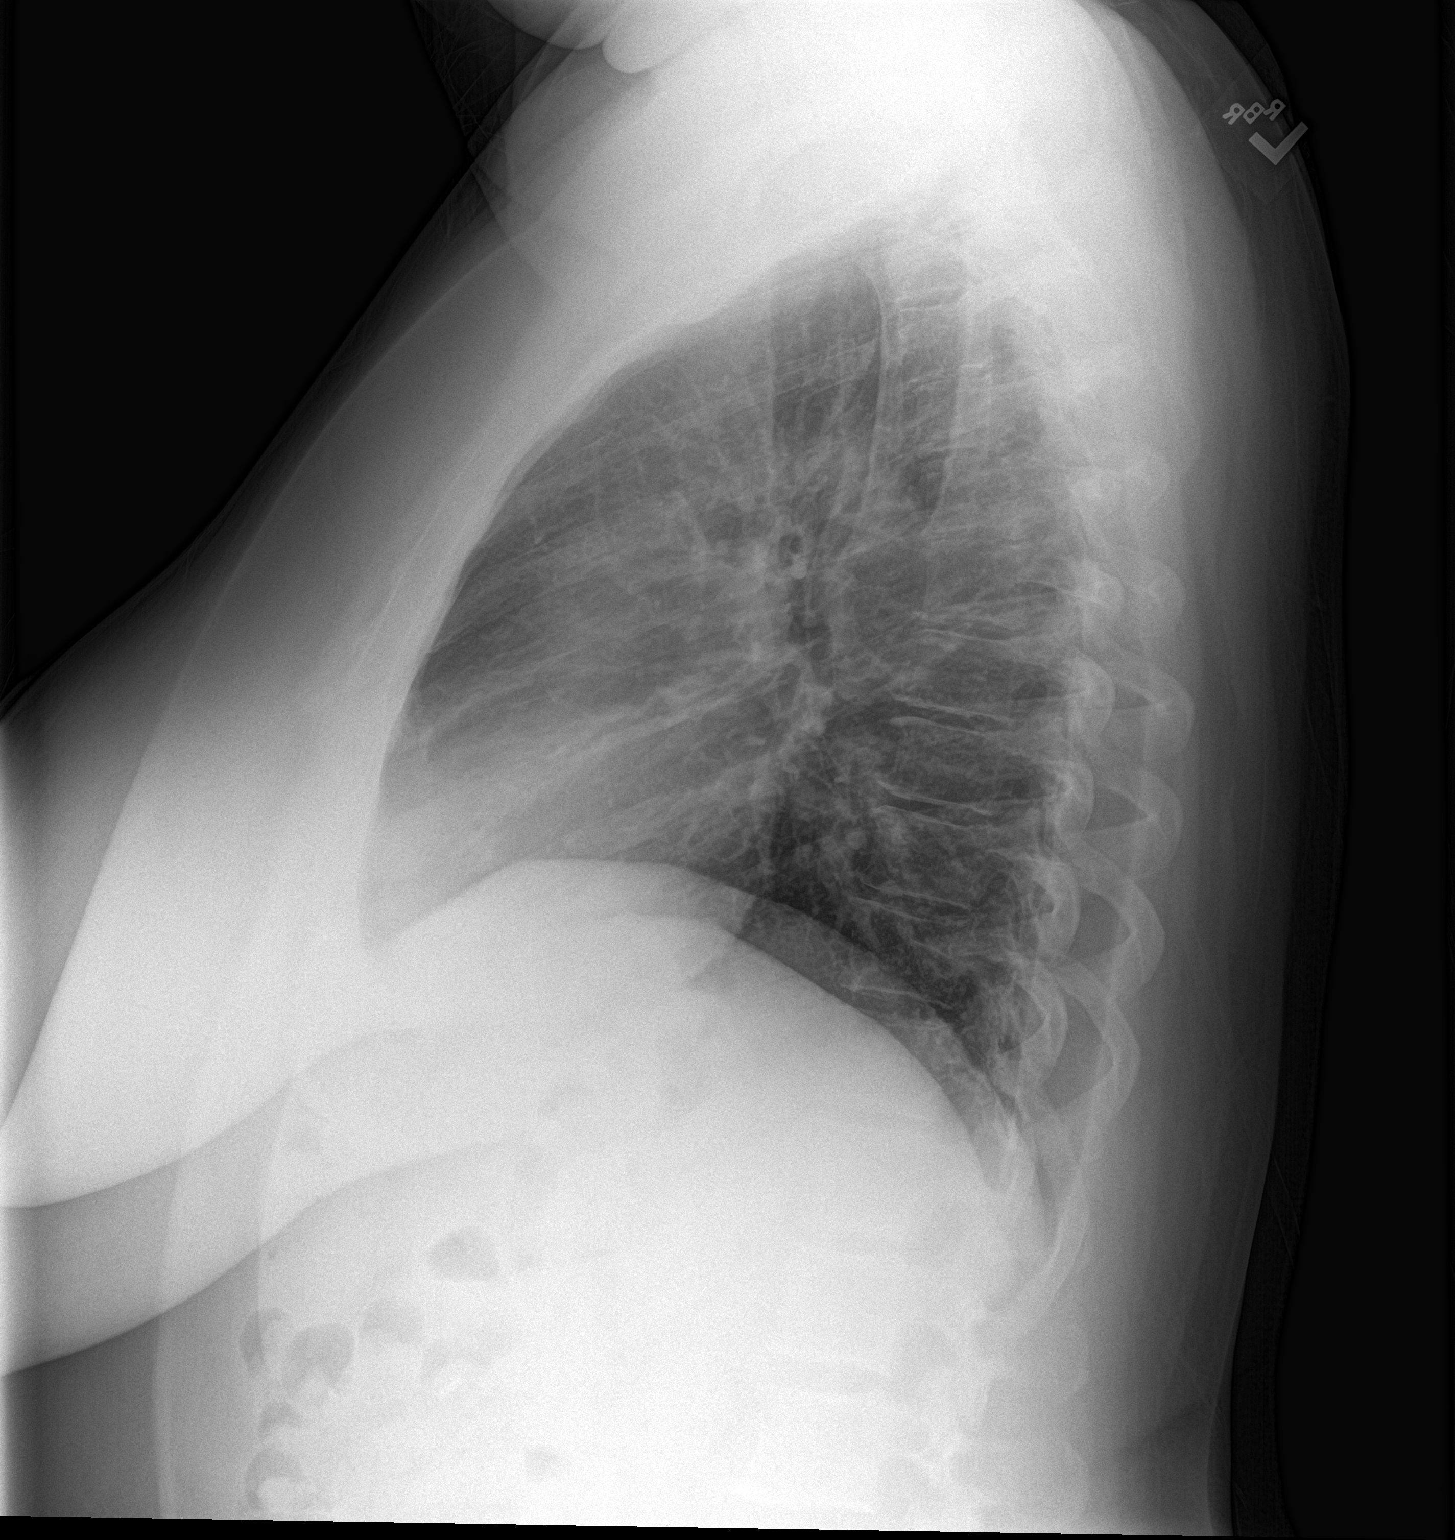

[2 of 2 positions shown; findings below may reference images not displayed]

FINDINGS: The heart size and mediastinal contours are within normal limits.
Both lungs are clear. The visualized skeletal structures are
unremarkable.
IMPRESSION: No active cardiopulmonary disease.

## 2019-12-22 DIAGNOSIS — M722 Plantar fascial fibromatosis: Secondary | ICD-10-CM | POA: Insufficient documentation

## 2021-05-21 DIAGNOSIS — Z8739 Personal history of other diseases of the musculoskeletal system and connective tissue: Secondary | ICD-10-CM | POA: Insufficient documentation

## 2021-05-21 DIAGNOSIS — J302 Other seasonal allergic rhinitis: Secondary | ICD-10-CM | POA: Insufficient documentation

## 2021-05-21 DIAGNOSIS — Z8781 Personal history of (healed) traumatic fracture: Secondary | ICD-10-CM | POA: Insufficient documentation

## 2021-05-21 DIAGNOSIS — H9190 Unspecified hearing loss, unspecified ear: Secondary | ICD-10-CM | POA: Insufficient documentation

## 2021-05-21 LAB — HM COLONOSCOPY

## 2022-01-20 ENCOUNTER — Encounter: Payer: Self-pay | Admitting: Family Medicine

## 2022-01-20 ENCOUNTER — Other Ambulatory Visit: Payer: Self-pay | Admitting: Family Medicine

## 2022-01-20 ENCOUNTER — Ambulatory Visit (INDEPENDENT_AMBULATORY_CARE_PROVIDER_SITE_OTHER): Payer: 59 | Admitting: Family Medicine

## 2022-01-20 VITALS — BP 154/90 | HR 82 | Temp 98.1°F | Ht 62.0 in | Wt 239.3 lb

## 2022-01-20 DIAGNOSIS — Z7689 Persons encountering health services in other specified circumstances: Secondary | ICD-10-CM

## 2022-01-20 DIAGNOSIS — R5383 Other fatigue: Secondary | ICD-10-CM

## 2022-01-20 DIAGNOSIS — R635 Abnormal weight gain: Secondary | ICD-10-CM | POA: Diagnosis not present

## 2022-01-20 DIAGNOSIS — E559 Vitamin D deficiency, unspecified: Secondary | ICD-10-CM | POA: Insufficient documentation

## 2022-01-20 DIAGNOSIS — Z6841 Body Mass Index (BMI) 40.0 and over, adult: Secondary | ICD-10-CM

## 2022-01-20 DIAGNOSIS — I10 Essential (primary) hypertension: Secondary | ICD-10-CM | POA: Diagnosis not present

## 2022-01-20 DIAGNOSIS — R7302 Impaired glucose tolerance (oral): Secondary | ICD-10-CM | POA: Diagnosis not present

## 2022-01-20 DIAGNOSIS — R0683 Snoring: Secondary | ICD-10-CM

## 2022-01-20 DIAGNOSIS — E782 Mixed hyperlipidemia: Secondary | ICD-10-CM | POA: Insufficient documentation

## 2022-01-20 MED ORDER — LOSARTAN POTASSIUM 25 MG PO TABS: 25.0000 mg | ORAL_TABLET | Freq: Every day | ORAL | 1 refills | Status: DC

## 2022-01-20 MED ORDER — HYDROCHLOROTHIAZIDE 25 MG PO TABS: 25.0000 mg | ORAL_TABLET | Freq: Every day | ORAL | 1 refills | Status: DC

## 2022-01-20 MED ORDER — AMLODIPINE BESYLATE 10 MG PO TABS: 10.0000 mg | ORAL_TABLET | Freq: Every day | ORAL | 1 refills | Status: DC

## 2022-01-20 NOTE — Progress Notes (Signed)
New Patient Office Visit  Subjective    Patient ID: Rachael Young, female    DOB: 07-04-1970  Age: 51 y.o. MRN: [id number]  CC:  Chief Complaint  Patient presents with   New Patient (Initial Visit)    Patient also c/o  menopausal body aches and pains,  wanting to discuss options for weight loss, and also wanting to wean off of  fluoxetine if possible. Patient will schedule a yearly physical at later time. Patient states she does not have Amlodipine allergy and she is currently taking this medication.     HPI Rachael Young presents to establish care  Her last physical was November.  Pt has hx of GAD and was on Prozac 20mg  for the last 5 years. She reports she feels like she can come off of it. She feels like she has issues with sleep and feeling fatigue.  She has hx of HTN and taking HCTZ 25mg  daily along with Losartan 25mg  and Amlodipine 10mg  daily.  She reports a hx of weight gain and would like to discuss options for weight loss. She has hx of weight gain with Lexapro. She then had gastric sleeve in 2014. She lost weight at that time but over the years her weight will fluctuate. She has tried going to Hcg injections. She has hx of ablation in 2015 due to irregular menses and fibroids.  She will return for physical exam this year.  Outpatient Encounter Medications as of 01/20/2022  Medication Sig   FLUoxetine (PROZAC) 20 MG capsule Take 20 mg daily by mouth.   [DISCONTINUED] amLODipine (NORVASC) 10 MG tablet Take 10 mg by mouth daily.   [DISCONTINUED] hydrochlorothiazide (HYDRODIURIL) 25 MG tablet Take 25 mg by mouth daily.   [DISCONTINUED] losartan (COZAAR) 25 MG tablet Take 25 mg by mouth daily.   amLODipine (NORVASC) 10 MG tablet Take 1 tablet (10 mg total) by mouth daily.   hydrochlorothiazide (HYDRODIURIL) 25 MG tablet Take 1 tablet (25 mg total) by mouth daily.   losartan (COZAAR) 25 MG tablet Take 1 tablet (25 mg total) by mouth daily.   [DISCONTINUED] albuterol (PROVENTIL  HFA;VENTOLIN HFA) 108 (90 Base) MCG/ACT inhaler Inhale 1-2 puffs into the lungs every 6 (six) hours as needed for wheezing or shortness of breath.   [DISCONTINUED] amLODipine (NORVASC) 10 MG tablet Take 10 mg by mouth daily.   [DISCONTINUED] hydrochlorothiazide (MICROZIDE) 12.5 MG capsule Take 12.5 mg daily by mouth.   [DISCONTINUED] ibuprofen (ADVIL,MOTRIN) 200 MG tablet Take three tablets ( 600 milligrams total) every 6 with food as needed for pain.   [DISCONTINUED] predniSONE (DELTASONE) 20 MG tablet 3 tabs po day one, then 2 po daily x 4 days   No facility-administered encounter medications on file as of 01/20/2022.    Past Medical History:  Diagnosis Date   GAD (generalized anxiety disorder)    Hypertension     Past Surgical History:  Procedure Laterality Date   ABLATION     uterine due to abnormal menses   CESAREAN SECTION     HEEL SPUR RESECTION Bilateral    LAPAROSCOPIC GASTRIC SLEEVE RESECTION      Family History  Problem Relation Age of Onset   Hypertension Mother    Thyroid disease Mother    Depression Mother    Hypertension Father    Coronary artery disease Father     Social History   Socioeconomic History   Marital status: Married    Spouse name: Not on file   Number of children:  1   Years of education: Not on file   Highest education level: Not on file  Occupational History   Not on file  Tobacco Use   Smoking status: Never   Smokeless tobacco: Never  Vaping Use   Vaping Use: Never used  Substance and Sexual Activity   Alcohol use: No   Drug use: No   Sexual activity: Not on file  Other Topics Concern   Not on file  Social History Narrative   Not on file   Social Determinants of Health   Financial Resource Strain: Not on file  Food Insecurity: Not on file  Transportation Needs: Not on file  Physical Activity: Not on file  Stress: Not on file  Social Connections: Not on file  Intimate Partner Violence: Not on file    Review of Systems   Constitutional:  Positive for malaise/fatigue. Negative for fever and weight loss.       WEIGHT GAIN   HENT:  Negative for congestion and ear pain.   Eyes:  Negative for blurred vision and double vision.  Respiratory:  Negative for cough and wheezing.   Cardiovascular:  Negative for chest pain and palpitations.  Gastrointestinal:  Negative for heartburn, nausea and vomiting.  Genitourinary:  Negative for dysuria and urgency.  Musculoskeletal:  Negative for myalgias and neck pain.  Neurological:  Negative for dizziness and headaches.  Endo/Heme/Allergies:  Negative for environmental allergies. Does not bruise/bleed easily.  Psychiatric/Behavioral:  Positive for depression. The patient is nervous/anxious.         Objective    BP (!) 154/90   Pulse 82   Temp 98.1 F (36.7 C)   Ht 5\' 2"  (1.575 m)   Wt 239 lb 5 oz (108.6 kg)   SpO2 97%   BMI 43.77 kg/m   Physical Exam Vitals reviewed.  Constitutional:      Appearance: Normal appearance. She is obese.  HENT:     Head: Normocephalic.     Right Ear: Tympanic membrane, ear canal and external ear normal.     Left Ear: Tympanic membrane, ear canal and external ear normal.     Nose: Nose normal.     Mouth/Throat:     Mouth: Mucous membranes are moist.  Cardiovascular:     Rate and Rhythm: Normal rate and regular rhythm.     Pulses: Normal pulses.  Pulmonary:     Effort: Pulmonary effort is normal.     Breath sounds: Normal breath sounds.  Abdominal:     General: Abdomen is flat. Bowel sounds are normal.  Musculoskeletal:        General: Normal range of motion.  Skin:    General: Skin is warm and dry.  Neurological:     General: No focal deficit present.     Mental Status: She is alert. Mental status is at baseline.        Assessment & Plan:   Problem List Items Addressed This Visit   None Visit Diagnoses     Encounter to establish care with new doctor    -  Primary   Primary hypertension       Relevant  Medications   hydrochlorothiazide (HYDRODIURIL) 25 MG tablet   amLODipine (NORVASC) 10 MG tablet   losartan (COZAAR) 25 MG tablet   Impaired glucose tolerance       Relevant Orders   Hemoglobin A1c   Other fatigue       Relevant Orders   Ambulatory referral to Pulmonology  TSH   T4, free   Weight gain       Relevant Orders   Ambulatory referral to Pulmonology   Amb Ref to Medical Weight Management   Snoring       Relevant Orders   Ambulatory referral to Pulmonology   BMI 40.0-44.9, adult ([place])       Relevant Orders   Amb Ref to Medical Weight Management      Due to fatigue and weight gain, screening thyroid Also due to snoring, refer for sleep study to rule out OSA. Refilled chronic medicines for HTN Stay on prozac 20mg  for now pending lab work and recheck [place] during annual in 4-6 weeks. Refer for weight management  Return in about 4 weeks (around 02/17/2022) for Annual Physical.   Leeanne Rio, MD

## 2022-01-21 LAB — TSH: TSH: 1.84 u[IU]/mL (ref 0.450–4.500)

## 2022-01-21 LAB — HEMOGLOBIN A1C
Est. average glucose Bld gHb Est-mCnc: 117 mg/dL
Hgb A1c MFr Bld: 5.7 % — ABNORMAL HIGH (ref 4.8–5.6)

## 2022-01-21 LAB — T4, FREE: Free T4: 0.95 ng/dL (ref 0.82–1.77)

## 2022-01-22 ENCOUNTER — Encounter: Payer: Self-pay | Admitting: Family Medicine

## 2022-01-22 NOTE — Progress Notes (Signed)
Per Dr. Wyline Mood-  diabetes screen was negative and thyroid function was within normal range. Patient informed of results.

## 2022-02-05 ENCOUNTER — Encounter: Payer: 59 | Admitting: Bariatrics

## 2022-02-21 ENCOUNTER — Encounter: Payer: Self-pay | Admitting: Family Medicine

## 2022-02-21 ENCOUNTER — Ambulatory Visit (INDEPENDENT_AMBULATORY_CARE_PROVIDER_SITE_OTHER): Payer: 59 | Admitting: Family Medicine

## 2022-02-21 VITALS — BP 135/84 | HR 76 | Temp 97.9°F | Ht 62.0 in | Wt 242.2 lb

## 2022-02-21 DIAGNOSIS — I10 Essential (primary) hypertension: Secondary | ICD-10-CM

## 2022-02-21 DIAGNOSIS — Z1159 Encounter for screening for other viral diseases: Secondary | ICD-10-CM

## 2022-02-21 DIAGNOSIS — Z124 Encounter for screening for malignant neoplasm of cervix: Secondary | ICD-10-CM

## 2022-02-21 DIAGNOSIS — Z0000 Encounter for general adult medical examination without abnormal findings: Secondary | ICD-10-CM | POA: Diagnosis not present

## 2022-02-21 DIAGNOSIS — Z13220 Encounter for screening for lipoid disorders: Secondary | ICD-10-CM

## 2022-02-21 DIAGNOSIS — Z1231 Encounter for screening mammogram for malignant neoplasm of breast: Secondary | ICD-10-CM

## 2022-02-21 DIAGNOSIS — Z136 Encounter for screening for cardiovascular disorders: Secondary | ICD-10-CM

## 2022-02-21 NOTE — Progress Notes (Signed)
Complete physical exam  Patient: Rachael Young   DOB: 01-24-1971   51 y.o. Female  MRN: [id number]  Subjective:    Chief Complaint  Patient presents with  . Annual Exam    Pap    Rachael Young is a 51 y.o. female who presents today for a complete physical exam. She reports consuming a general diet. The patient does not participate in regular exercise at present. She generally feels fairly well. She reports sleeping fairly well. She does not have additional problems to discuss today.    Most recent fall risk assessment:    01/20/2022    3:42 PM  Fall Risk   Falls in the past year? 0  Number falls in past yr: 0  Injury with Fall? 0  Risk for fall due to : No Fall Risks  Follow up Falls evaluation completed     Most recent depression screenings:    01/20/2022    2:08 PM  PHQ 2/9 Scores  PHQ - 2 Score 2  PHQ- 9 Score 16    Vision:Within last year  Patient Active Problem List   Diagnosis Date Noted  . Vitamin D deficiency 01/20/2022  . Mixed hyperlipidemia 01/20/2022  . Seasonal allergies 05/21/2021  . History of skull fracture 05/21/2021  . History of degenerative disc disease 05/21/2021  . Hearing loss 05/21/2021  . Plantar fasciitis, right 12/22/2019  . Bilateral sensorineural hearing loss 10/12/2015  . 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 Morbid obesity (HCC) 03/02/2012  . Depression 03/02/2012  . Essential hypertension 03/02/2012  . 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   Past Medical History:  Diagnosis Date  . Colon polyps   . GAD (generalized anxiety disorder)   . Hypertension    Past Surgical History:  Procedure Laterality Date  . ABLATION     uterine due to abnormal menses  . CESAREAN SECTION    . HEEL SPUR RESECTION Bilateral   . LAPAROSCOPIC GASTRIC SLEEVE RESECTION     Social History   Tobacco Use  . Smoking status: Never  . Smokeless tobacco: Never  Vaping Use  . Vaping Use: Never used  Substance Use Topics  .  Alcohol use: Yes    Alcohol/week: 1.0 standard drink of alcohol    Types: 1 Glasses of wine per week    Comment: social drinker  . Drug use: Never      Patient Care Team: Suzan Slick, MD as PCP - General (Family Medicine)   Outpatient Medications Prior to Visit  Medication Sig  . amLODipine (NORVASC) 10 MG tablet Take 1 tablet (10 mg total) by mouth daily.  Marland Kitchen FLUoxetine (PROZAC) 20 MG capsule Take 20 mg daily by mouth.  . hydrochlorothiazide (HYDRODIURIL) 25 MG tablet Take 1 tablet (25 mg total) by mouth daily.  Marland Kitchen losartan (COZAAR) 25 MG tablet Take 1 tablet (25 mg total) by mouth daily.   No facility-administered medications prior to visit.    Review of Systems  HENT:  Negative for ear pain and sore throat.   Respiratory:  Negative for cough and shortness of breath.   Cardiovascular:  Negative for chest pain and palpitations.  Musculoskeletal:  Negative for back pain and joint pain.  Neurological:  Negative for dizziness and headaches.  Psychiatric/Behavioral:  Negative for depression. The patient is not nervous/anxious.   All other systems reviewed and are negative.        Objective:     BP 135/84   Pulse 76  Temp 97.9 F (36.6 C)   Ht 5\' 2"  (1.575 m)   Wt 242 lb 4 oz (109.9 kg)   SpO2 99%   BMI 44.31 kg/m     Physical Exam Vitals and nursing note reviewed. Exam conducted with a chaperone present.  Constitutional:      Appearance: Normal appearance. She is obese.  HENT:     Head: Normocephalic and atraumatic.     Right Ear: Tympanic membrane, ear canal and external ear normal.     Left Ear: Tympanic membrane, ear canal and external ear normal.     Nose: Nose normal.     Mouth/Throat:     Mouth: Mucous membranes are moist.     Pharynx: Oropharynx is clear.  Eyes:     Extraocular Movements: Extraocular movements intact.     Conjunctiva/sclera: Conjunctivae normal.     Pupils: Pupils are equal, round, and reactive to light.  Cardiovascular:      Rate and Rhythm: Normal rate and regular rhythm.     Pulses: Normal pulses.     Heart sounds: Normal heart sounds.  Pulmonary:     Effort: Pulmonary effort is normal.     Breath sounds: Normal breath sounds.  Abdominal:     General: Abdomen is flat. Bowel sounds are normal.  Genitourinary:    General: Normal vulva.     Rectum: Normal.     Comments: Pap taken, no CMT Breast: exam performed, no palpable masses in either breast Musculoskeletal:     Cervical back: Normal range of motion and neck supple.  Skin:    General: Skin is warm.     Capillary Refill: Capillary refill takes less than 2 seconds.  Neurological:     General: No focal deficit present.     Mental Status: She is alert and oriented to person, place, and time. Mental status is at baseline.  Psychiatric:        Mood and Affect: Mood normal.        Behavior: Behavior normal.        Thought Content: Thought content normal.        Judgment: Judgment normal.     No results found for any visits on 02/21/22. Last hemoglobin A1c Lab Results  Component Value Date   HGBA1C 5.7 (H) 01/20/2022   Last thyroid functions Lab Results  Component Value Date   TSH 1.840 01/20/2022        Assessment & Plan:    Routine Health Maintenance and Physical Exam   There is no immunization history on file for this patient.  Health Maintenance  Topic Date Due  . COVID-19 Vaccine (1) Never done  . HIV Screening  Never done  . Hepatitis C Screening  Never done  . DTaP/Tdap/Td (1 - Tdap) Never done  . PAP SMEAR-Modifier  Never done  . INFLUENZA VACCINE  Never done  . COLONOSCOPY (Pts 45-50yrs Insurance coverage will need to be confirmed)  05/22/2031  . HPV VACCINES  Aged Out    Discussed health benefits of physical activity, and encouraged her to engage in regular exercise appropriate for her age and condition.  Problem List Items Addressed This Visit   None Visit Diagnoses     Annual physical exam    -  Primary    Encounter for lipid screening for cardiovascular disease       Need for hepatitis C screening test       Screening for viral disease  Screening for cervical cancer       Primary hypertension         Screening labs Refer for mammogram Declines all vaccines Sees weight loss clinic on Jan 8th Blood pressure much better!  No follow-ups on file.     Suzan Slick, MD

## 2022-02-22 LAB — COMPREHENSIVE METABOLIC PANEL
ALT: 18 IU/L (ref 0–32)
AST: 16 IU/L (ref 0–40)
Albumin/Globulin Ratio: 1.6 (ref 1.2–2.2)
Albumin: 4.5 g/dL (ref 3.9–4.9)
Alkaline Phosphatase: 69 IU/L (ref 44–121)
BUN/Creatinine Ratio: 14 (ref 9–23)
BUN: 11 mg/dL (ref 6–24)
Bilirubin Total: 0.3 mg/dL (ref 0.0–1.2)
CO2: 26 mmol/L (ref 20–29)
Calcium: 9.4 mg/dL (ref 8.7–10.2)
Chloride: 100 mmol/L (ref 96–106)
Creatinine, Ser: 0.78 mg/dL (ref 0.57–1.00)
Globulin, Total: 2.8 g/dL (ref 1.5–4.5)
Glucose: 92 mg/dL (ref 70–99)
Potassium: 4.2 mmol/L (ref 3.5–5.2)
Sodium: 141 mmol/L (ref 134–144)
Total Protein: 7.3 g/dL (ref 6.0–8.5)
eGFR: 93 mL/min/{1.73_m2} (ref >59)

## 2022-02-22 LAB — CBC WITH DIFFERENTIAL/PLATELET
Basophils Absolute: 0.1 10*3/uL (ref 0.0–0.2)
Basos: 1 %
EOS (ABSOLUTE): 0.2 10*3/uL (ref 0.0–0.4)
Eos: 3 %
Hematocrit: 41.7 % (ref 34.0–46.6)
Hemoglobin: 13.6 g/dL (ref 11.1–15.9)
Immature Grans (Abs): 0 10*3/uL (ref 0.0–0.1)
Immature Granulocytes: 0 %
Lymphocytes Absolute: 2.3 10*3/uL (ref 0.7–3.1)
Lymphs: 28 %
MCH: 27.3 pg (ref 26.6–33.0)
MCHC: 32.6 g/dL (ref 31.5–35.7)
MCV: 84 fL (ref 79–97)
Monocytes Absolute: 0.5 10*3/uL (ref 0.1–0.9)
Monocytes: 6 %
Neutrophils Absolute: 5 10*3/uL (ref 1.4–7.0)
Neutrophils: 62 %
Platelets: 417 10*3/uL (ref 150–450)
RBC: 4.98 x10E6/uL (ref 3.77–5.28)
RDW: 13.2 % (ref 11.7–15.4)
WBC: 8.1 10*3/uL (ref 3.4–10.8)

## 2022-02-22 LAB — LIPID PANEL
Chol/HDL Ratio: 4.1 ratio (ref 0.0–4.4)
Cholesterol, Total: 253 mg/dL — ABNORMAL HIGH (ref 100–199)
HDL: 62 mg/dL (ref >39)
LDL Chol Calc (NIH): 167 mg/dL — ABNORMAL HIGH (ref 0–99)
Triglycerides: 133 mg/dL (ref 0–149)
VLDL Cholesterol Cal: 24 mg/dL (ref 5–40)

## 2022-02-22 LAB — HIV ANTIBODY (ROUTINE TESTING W REFLEX): HIV Screen 4th Generation wRfx: NONREACTIVE

## 2022-02-22 LAB — HEPATITIS C ANTIBODY: Hep C Virus Ab: NONREACTIVE

## 2022-02-25 ENCOUNTER — Other Ambulatory Visit: Payer: Self-pay | Admitting: Family Medicine

## 2022-02-25 DIAGNOSIS — E782 Mixed hyperlipidemia: Secondary | ICD-10-CM

## 2022-02-25 MED ORDER — ATORVASTATIN CALCIUM 10 MG PO TABS: 10.0000 mg | ORAL_TABLET | Freq: Every evening | ORAL | 3 refills | Status: DC

## 2022-02-28 LAB — IGP, APTIMA HPV
HPV Aptima: NEGATIVE
PAP Smear Comment: 0

## 2022-03-03 ENCOUNTER — Encounter: Payer: Self-pay | Admitting: Bariatrics

## 2022-03-03 ENCOUNTER — Ambulatory Visit (INDEPENDENT_AMBULATORY_CARE_PROVIDER_SITE_OTHER): Payer: 59 | Admitting: Bariatrics

## 2022-03-03 VITALS — BP 133/82 | HR 69 | Temp 97.9°F | Ht 63.0 in | Wt 241.0 lb

## 2022-03-03 DIAGNOSIS — E538 Deficiency of other specified B group vitamins: Secondary | ICD-10-CM | POA: Insufficient documentation

## 2022-03-03 DIAGNOSIS — Z0000 Encounter for general adult medical examination without abnormal findings: Secondary | ICD-10-CM

## 2022-03-03 DIAGNOSIS — E559 Vitamin D deficiency, unspecified: Secondary | ICD-10-CM

## 2022-03-03 DIAGNOSIS — I10 Essential (primary) hypertension: Secondary | ICD-10-CM | POA: Diagnosis not present

## 2022-03-03 DIAGNOSIS — Z1331 Encounter for screening for depression: Secondary | ICD-10-CM

## 2022-03-03 DIAGNOSIS — Z9884 Bariatric surgery status: Secondary | ICD-10-CM | POA: Insufficient documentation

## 2022-03-03 DIAGNOSIS — E669 Obesity, unspecified: Secondary | ICD-10-CM

## 2022-03-03 DIAGNOSIS — R0602 Shortness of breath: Secondary | ICD-10-CM | POA: Diagnosis not present

## 2022-03-03 DIAGNOSIS — R7303 Prediabetes: Secondary | ICD-10-CM | POA: Diagnosis not present

## 2022-03-03 DIAGNOSIS — E782 Mixed hyperlipidemia: Secondary | ICD-10-CM | POA: Diagnosis not present

## 2022-03-03 DIAGNOSIS — R5383 Other fatigue: Secondary | ICD-10-CM | POA: Diagnosis not present

## 2022-03-03 DIAGNOSIS — Z0289 Encounter for other administrative examinations: Secondary | ICD-10-CM

## 2022-03-03 DIAGNOSIS — Z6841 Body Mass Index (BMI) 40.0 and over, adult: Secondary | ICD-10-CM

## 2022-03-04 LAB — VITAMIN B12: Vitamin B-12: 702 pg/mL (ref 232–1245)

## 2022-03-04 LAB — INSULIN, RANDOM: INSULIN: 11.9 u[IU]/mL (ref 2.6–24.9)

## 2022-03-04 LAB — VITAMIN D 25 HYDROXY (VIT D DEFICIENCY, FRACTURES): Vit D, 25-Hydroxy: 26 ng/mL — ABNORMAL LOW (ref 30.0–100.0)

## 2022-03-10 ENCOUNTER — Ambulatory Visit (INDEPENDENT_AMBULATORY_CARE_PROVIDER_SITE_OTHER): Payer: 59 | Admitting: Family Medicine

## 2022-03-10 ENCOUNTER — Encounter: Payer: Self-pay | Admitting: Family Medicine

## 2022-03-10 VITALS — BP 129/81 | HR 75 | Temp 98.1°F | Ht 63.0 in | Wt 243.3 lb

## 2022-03-10 DIAGNOSIS — R41840 Attention and concentration deficit: Secondary | ICD-10-CM

## 2022-03-10 DIAGNOSIS — E782 Mixed hyperlipidemia: Secondary | ICD-10-CM | POA: Diagnosis not present

## 2022-03-10 MED ORDER — ATOMOXETINE HCL 10 MG PO CAPS: 10.0000 mg | ORAL_CAPSULE | Freq: Every day | ORAL | 1 refills | Status: DC

## 2022-03-10 MED ORDER — ATORVASTATIN CALCIUM 10 MG PO TABS: 5.0000 mg | ORAL_TABLET | Freq: Every evening | ORAL | 3 refills | Status: DC

## 2022-03-10 NOTE — Progress Notes (Signed)
Established Patient Office Visit  Subjective   Patient ID: Rachael Young, female    DOB: 06/07/1970  Age: 52 y.o. MRN: [id number]  Chief Complaint  Patient presents with   medication  questions    Patient in office - requesting Adderall for menopause symptoms of brain fog and feelings of being "all over the place"  and patient states that cholesterol medication caused bad headaches and burning sensation over nasal bridge- stopped taking this medication - last does was Friday  03/07/21    HPI  Pt was seen for annual recently and was noted to have HLD. She was started on Atorvastatin 10mg . She reports after  a few doses, she began having headaches. She stopped it and the symptoms resolved.  Pt says her friend is going through menopausal symptoms. She is having brain fog for a long time. She says her friend was started on Adderall and this helped her symptoms with brain fog. She says she has issues with attention. She says she can be in meetings at work and unable to focus. She says she can't stay put on the tasks. Pt is taking Prozac for mood and anxiety. She takes this in the morning.   Review of Systems  Neurological:  Positive for headaches.       Headaches with taking Atorvastatin  Psychiatric/Behavioral:  The patient is nervous/anxious.        Inattention      Objective:     BP 129/81   Pulse 75   Temp 98.1 F (36.7 C)   Ht 5\' 3"  (1.6 m)   Wt 243 lb 5 oz (110.4 kg)   SpO2 98%   BMI 43.10 kg/m    Physical Exam Vitals and nursing note reviewed.  Constitutional:      Appearance: Normal appearance. She is obese.  HENT:     Head: Normocephalic and atraumatic.     Right Ear: External ear normal.     Left Ear: External ear normal.     Nose: Nose normal.     Mouth/Throat:     Mouth: Mucous membranes are moist.     Pharynx: Oropharynx is clear.  Eyes:     Extraocular Movements: Extraocular movements intact.  Cardiovascular:     Rate and Rhythm: Normal rate.   Pulmonary:     Effort: Pulmonary effort is normal.  Neurological:     General: No focal deficit present.     Mental Status: She is alert and oriented to person, place, and time. Mental status is at baseline.  Psychiatric:        Mood and Affect: Mood normal.        Behavior: Behavior normal.        Thought Content: Thought content normal.        Judgment: Judgment normal.     No results found for any visits on 03/10/22.    The 10-year ASCVD risk score (Arnett DK, et al., 2019) is: 1.8%    Assessment & Plan:   Problem List Items Addressed This Visit       Other   Mixed hyperlipidemia   Relevant Medications   atorvastatin (LIPITOR) 10 MG tablet   Other Visit Diagnoses     Inattention    -  Primary   Relevant Medications   atomoxetine (STRATTERA) 10 MG capsule     Advised that since pt had side effects with Atorvastatin 10mg . May try cutting this in half and taking 5mg . If headaches return, will stop  and add to intolerant list. She may then try fish oil at that time or Zetia.  For inattention, will send in trial of Strattera 10mg  to help. Continue Prozac 20mg  and follow up in 6 weeks.  Return in about 6 weeks (around 04/21/2022) for Inattention.    Leeanne Rio, MD

## 2022-03-13 NOTE — Progress Notes (Signed)
Chief Complaint:   OBESITY Rachael Young (MR# [id number]) is a 52 y.o. female who presents for evaluation and treatment of obesity and related comorbidities. Current BMI is Body mass index is 42.69 kg/m. Rachael Young has been struggling with her weight for many years and has been unsuccessful in either losing weight, maintaining weight loss, or reaching her healthy weight goal.  Rachael Young is currently in the action stage of change and ready to dedicate time achieving and maintaining a healthier weight. Rachael Young is interested in becoming our patient and working on intensive lifestyle modifications including (but not limited to) diet and exercise for weight loss.  Rachael Young states that she does not like to cook.  Rachael Young's habits were reviewed today and are as follows: Her family eats meals together, she thinks her family will eat healthier with her, her desired weight loss is 76 lbs, she has been heavy most of her life, she started gaining weight at 52 years old, her heaviest weight ever was 285 pounds, she is a picky eater and doesn't like to eat healthier foods, she has significant food cravings issues, she snacks frequently in the evenings, she is frequently drinking liquids with calories, she frequently makes poor food choices, she has problems with excessive hunger, she frequently eats larger portions than normal, she has binge eating behaviors, and she struggles with emotional eating.  Depression Screen Rachael Young's Food and Mood (modified PHQ-9) score was 24.     01/20/2022    2:08 PM  Depression screen PHQ 2/9  Decreased Interest 2  Down, Depressed, Hopeless 0  PHQ - 2 Score 2  Altered sleeping 3  Tired, decreased energy 3  Change in appetite 3  Feeling bad or failure about yourself  2  Trouble concentrating 3  Moving slowly or fidgety/restless 0  Suicidal thoughts 0  PHQ-9 Score 16  Difficult doing work/chores Somewhat difficult   Subjective:   1. Other fatigue Ayse admits to  daytime somnolence and admits to waking up still tired. Patient has a history of symptoms of daytime fatigue and morning fatigue. Rachael Young generally gets  >6  hours of sleep per night, and states that she has poor sleep quality. Snoring is present. Apneic episodes are not present. Epworth Sleepiness Score is 10.  RMR actual= 1958 cal RMR expected= 1703 kcal  2. SOB (shortness of breath) on exertion Rachael Young notes increasing shortness of breath with exercising and seems to be worsening over time with weight gain. She notes getting out of breath sooner with activity than she used to. This has gotten worse recently. Rachael Young denies shortness of breath at rest or orthopnea.  3. Essential hypertension Rachael Young is taking losartan, amlodipine, and HCTZ. Her BP is controlled with medication.  4. Mixed hyperlipidemia Pt started taking atorvastatin last week. Her total cholesterol is 253 and LDL 167.  5. Health care maintenance Pt with obesity.  6. Prediabetes A1c 5.7. Rachael Young is no on medication.  7. 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 She is taking a multivitamin and has no problems.  8. Vitamin D deficiency Rachael Young is taking a multivitamin daily.  9. Vitamin B 12 deficiency Rachael Young is taking a multivitamin daily.  Assessment/Plan:   1. Other fatigue Maretta does feel that her weight is causing her energy to be lower than it should be. Fatigue may be related to obesity, depression or many other causes. Labs will be ordered, and in the meanwhile, Iyani will focus on self care including making  healthy food choices, increasing physical activity and focusing on stress reduction. Discussed the implications for the plan and exercise.  - EKG 12-Lead  2. SOB (shortness of breath) on exertion Keyaria does feel that she gets out of breath more easily that she used to when she exercises. Alecia's shortness of breath appears to be obesity related and exercise induced. She has agreed to  work on weight loss and gradually increase exercise to treat her exercise induced shortness of breath. Will continue to monitor closely.  3. Essential hypertension Continue medication as prescribed.  4. Mixed hyperlipidemia Continue atorvastatin as directed.  5. Health care maintenance Obtain EKG, labs, and IC.   Lab/Orders today: - Insulin, random  6. Prediabetes Discussed A1c.  Lab/Orders today: - Insulin, random  7. History of bariatric surgery Increase water and protein intake.  8. Vitamin D deficiency Continue multivitamin.  Lab/Orders today: - VITAMIN D 25 Hydroxy (Vit-D Deficiency, Fractures)  9. Vitamin B 12 deficiency Continue multivitamin.  Lab/Orders today: - Vitamin B12  10. Depression screening Rachael Young had a positive depression screening. Depression is commonly associated with obesity and often results in emotional eating behaviors. We will monitor this closely and work on CBT to help improve the non-hunger eating patterns. Referral to Psychology may be required if no improvement is seen as she continues in our clinic.  11. Obesity, Current BMI 42.7 Rachael Young is currently in the action stage of change and her goal is to continue with weight loss efforts. I recommend Rachael Young begin the structured treatment plan as follows:  She has agreed to the Category 3 Plan.  Exercise goals: No exercise has been prescribed at this time.   Behavioral modification strategies: increasing vegetables, increasing water intake, decreasing alcohol intake, keeping healthy foods in the home, and planning for success.  She was informed of the importance of frequent follow-up visits to maximize her success with intensive lifestyle modifications for her multiple health conditions. She was informed we would discuss her lab results at her next visit unless there is a critical issue that needs to be addressed sooner. Rachael Young agreed to keep her next visit at the agreed upon time to discuss  these results.  Objective:   Blood pressure 133/82, pulse 69, temperature 97.9 F (36.6 C), height 5\' 3"  (1.6 m), weight 241 lb (109.3 kg), SpO2 98 %. Body mass index is 42.69 kg/m.  EKG: Normal sinus rhythm, rate 64.  Indirect Calorimeter completed today shows a VO2 of 284 and a REE of 1958.  Her calculated basal metabolic rate is 5809 thus her basal metabolic rate is better than expected.  General: Cooperative, alert, well developed, in no acute distress. HEENT: Conjunctivae and lids unremarkable. Cardiovascular: Regular rhythm.  Lungs: Normal work of breathing. Neurologic: No focal deficits.   Lab Results  Component Value Date   CREATININE 0.78 02/21/2022   BUN 11 02/21/2022   NA 141 02/21/2022   K 4.2 02/21/2022   CL 100 02/21/2022   CO2 26 02/21/2022   Lab Results  Component Value Date   ALT 18 02/21/2022   AST 16 02/21/2022   ALKPHOS 69 02/21/2022   BILITOT 0.3 02/21/2022   Lab Results  Component Value Date   HGBA1C 5.7 (H) 01/20/2022   Lab Results  Component Value Date   INSULIN 11.9 03/03/2022   Lab Results  Component Value Date   TSH 1.840 01/20/2022   Lab Results  Component Value Date   CHOL 253 (H) 02/21/2022   HDL 62 02/21/2022  LDLCALC 167 (H) 02/21/2022   TRIG 133 02/21/2022   CHOLHDL 4.1 02/21/2022   Lab Results  Component Value Date   WBC 8.1 02/21/2022   HGB 13.6 02/21/2022   HCT 41.7 02/21/2022   MCV 84 02/21/2022   PLT 417 02/21/2022    Attestation Statements:   Reviewed by clinician on day of visit: allergies, medications, problem list, medical history, surgical history, family history, social history, and previous encounter notes.  I, Kyung Rudd, BS, CMA, am acting as transcriptionist for [employer], DO.  I have reviewed the above documentation for accuracy and completeness, and I agree with the above. Corinna Capra, DO

## 2022-03-17 ENCOUNTER — Encounter: Payer: Self-pay | Admitting: Bariatrics

## 2022-03-17 ENCOUNTER — Ambulatory Visit (INDEPENDENT_AMBULATORY_CARE_PROVIDER_SITE_OTHER): Payer: 59 | Admitting: Bariatrics

## 2022-03-17 VITALS — BP 130/83 | HR 75 | Temp 98.4°F | Ht 63.0 in | Wt 238.0 lb

## 2022-03-17 DIAGNOSIS — E88819 Insulin resistance, unspecified: Secondary | ICD-10-CM | POA: Insufficient documentation

## 2022-03-17 DIAGNOSIS — R7303 Prediabetes: Secondary | ICD-10-CM | POA: Insufficient documentation

## 2022-03-17 DIAGNOSIS — E559 Vitamin D deficiency, unspecified: Secondary | ICD-10-CM

## 2022-03-17 DIAGNOSIS — Z6841 Body Mass Index (BMI) 40.0 and over, adult: Secondary | ICD-10-CM | POA: Diagnosis not present

## 2022-03-17 DIAGNOSIS — E669 Obesity, unspecified: Secondary | ICD-10-CM

## 2022-03-17 MED ORDER — VITAMIN D (ERGOCALCIFEROL) 1.25 MG (50000 UNIT) PO CAPS: 50000.0000 [IU] | ORAL_CAPSULE | ORAL | 0 refills | Status: AC

## 2022-03-21 ENCOUNTER — Encounter: Payer: Self-pay | Admitting: Family Medicine

## 2022-03-24 ENCOUNTER — Other Ambulatory Visit: Payer: Self-pay | Admitting: Family Medicine

## 2022-03-24 DIAGNOSIS — R41840 Attention and concentration deficit: Secondary | ICD-10-CM

## 2022-03-24 MED ORDER — ATOMOXETINE HCL 10 MG PO CAPS: 20.0000 mg | ORAL_CAPSULE | Freq: Every day | ORAL | 1 refills | Status: DC

## 2022-03-25 ENCOUNTER — Encounter: Payer: Self-pay | Admitting: Bariatrics

## 2022-03-25 NOTE — Progress Notes (Signed)
Chief Complaint:   OBESITY Rachael Young is here to discuss her progress with her obesity treatment plan along with follow-up of her obesity related diagnoses. Rachael Young is on the Category 3 Plan and states she is following her eating plan approximately 0% of the time. Rachael Young states she is doing 0 minutes 0 times per week.  Today's visit was #: 2 Starting weight: 241 lbs Starting date: 03/03/2022 Today's weight: 238 lbs Today's date: 03/17/2022 Total lbs lost to date: 3 Total lbs lost since last in-office visit: 3  Interim History: Rachael Young is down 3 lbs since her initial visit. She did not follow the plan due to work and vacation.   Subjective:   1. Vitamin D deficiency Rachael Young's recent Vitamin D level was 26.0.  2. Insulin resistance Rachael Young is not on medications currently.   3. Pre-diabetes Rachael Young's recent A1c was 5.7.  Assessment/Plan:   1. Vitamin D deficiency Rachael Young agreed to start prescription Vitamin D, with no refills.   - Vitamin D, Ergocalciferol, (DRISDOL) 1.25 MG (50000 UNIT) CAPS capsule; Take 1 capsule (50,000 Units total) by mouth every 7 (seven) days.  Dispense: 5 capsule; Refill: 0  2. Insulin resistance Handouts on insulin resistance and pre-diabetes were given, as well as meal planning tips.   3. Pre-diabetes Rachael Young will follow her meal plan 80-90%. She will look into the book: The Essential Guide to Intermittent Fasting for Women.   4. Obesity, Current BMI 42.2 Rachael Young is currently in the action stage of change. As such, her goal is to continue with weight loss efforts. She has agreed to the Category 3 Plan.   She will adhere closely to the plan 80-90%. Reviewed labs with the patient from 03/03/2022, Vit D, B12, and insulin. Eating Out handout was given (time restriction eating).   Exercise goals: She will be going to the gym.   Behavioral modification strategies: increasing lean protein intake, decreasing simple carbohydrates, increasing vegetables, increasing  water intake, decreasing eating out, no skipping meals, meal planning and cooking strategies, keeping healthy foods in the home, and planning for success.  Rachael Young has agreed to follow-up with our clinic in 2 weeks. She was informed of the importance of frequent follow-up visits to maximize her success with intensive lifestyle modifications for her multiple health conditions.   Objective:   Blood pressure 130/83, pulse 75, temperature 98.4 F (36.9 C), height 5\' 3"  (1.6 m), SpO2 98 %. Body mass index is 43.1 kg/m.  General: Cooperative, alert, well developed, in no acute distress. HEENT: Conjunctivae and lids unremarkable. Cardiovascular: Regular rhythm.  Lungs: Normal work of breathing. Neurologic: No focal deficits.   Lab Results  Component Value Date   CREATININE 0.78 02/21/2022   BUN 11 02/21/2022   NA 141 02/21/2022   K 4.2 02/21/2022   CL 100 02/21/2022   CO2 26 02/21/2022   Lab Results  Component Value Date   ALT 18 02/21/2022   AST 16 02/21/2022   ALKPHOS 69 02/21/2022   BILITOT 0.3 02/21/2022   Lab Results  Component Value Date   HGBA1C 5.7 (H) 01/20/2022   Lab Results  Component Value Date   INSULIN 11.9 03/03/2022   Lab Results  Component Value Date   TSH 1.840 01/20/2022   Lab Results  Component Value Date   CHOL 253 (H) 02/21/2022   HDL 62 02/21/2022   LDLCALC 167 (H) 02/21/2022   TRIG 133 02/21/2022   CHOLHDL 4.1 02/21/2022   Lab Results  Component Value Date  VD25OH 26.0 (L) 03/03/2022   Lab Results  Component Value Date   WBC 8.1 02/21/2022   HGB 13.6 02/21/2022   HCT 41.7 02/21/2022   MCV 84 02/21/2022   PLT 417 02/21/2022   No results found for: "IRON", "TIBC", "FERRITIN"  Attestation Statements:   Reviewed by clinician on day of visit: allergies, medications, problem list, medical history, surgical history, family history, social history, and previous encounter notes.   Wilhemena Durie, am acting as Location manager for [employer], DO.  I have reviewed the above documentation for accuracy and completeness, and I agree with the above. Jearld Lesch, DO

## 2022-03-26 ENCOUNTER — Other Ambulatory Visit: Payer: Self-pay | Admitting: Family Medicine

## 2022-03-26 DIAGNOSIS — R41840 Attention and concentration deficit: Secondary | ICD-10-CM

## 2022-03-26 MED ORDER — ATOMOXETINE HCL 10 MG PO CAPS: 20.0000 mg | ORAL_CAPSULE | Freq: Every day | ORAL | 1 refills | Status: DC

## 2022-03-31 ENCOUNTER — Ambulatory Visit: Payer: 59 | Admitting: Nurse Practitioner

## 2022-04-21 ENCOUNTER — Encounter: Payer: Self-pay | Admitting: Family Medicine

## 2022-04-21 ENCOUNTER — Ambulatory Visit: Payer: 59 | Admitting: Family Medicine

## 2022-04-21 ENCOUNTER — Ambulatory Visit (INDEPENDENT_AMBULATORY_CARE_PROVIDER_SITE_OTHER): Payer: 59 | Admitting: Family Medicine

## 2022-04-21 DIAGNOSIS — R41840 Attention and concentration deficit: Secondary | ICD-10-CM | POA: Diagnosis not present

## 2022-04-21 MED ORDER — ATOMOXETINE HCL 10 MG PO CAPS: 20.0000 mg | ORAL_CAPSULE | Freq: Every day | ORAL | 5 refills | Status: DC

## 2022-04-21 NOTE — Progress Notes (Signed)
   Established Patient Office Visit  Subjective   Patient ID: Rachael Young, female    DOB: Apr 10, 1970  Age: 52 y.o. MRN: [id number]  Chief Complaint  Patient presents with   ADD    Pt states that it is a "miracle drug".    HPI  Pt reports since starting the Strattera and having it increased to the 43m daily, it has helped her tremendously. She also reports she use to pull her eyelashes and no longer does this. She says she is also doing check lists and completing those tasks more often than before.   Review of Systems  All other systems reviewed and are negative.     Objective:     BP 134/88   Pulse 87   Temp 98.2 F (36.8 C) (Oral)   Ht 5' 3"$  (1.6 m)   Wt 238 lb 12.8 oz (108.3 kg)   SpO2 95%   BMI 42.30 kg/m    Physical Exam Vitals and nursing note reviewed.  Constitutional:      Appearance: Normal appearance. She is normal weight.  HENT:     Head: Normocephalic and atraumatic.  Eyes:     Extraocular Movements: Extraocular movements intact.  Cardiovascular:     Rate and Rhythm: Normal rate.  Pulmonary:     Effort: Pulmonary effort is normal.  Neurological:     Mental Status: She is alert.  Psychiatric:        Mood and Affect: Mood normal.        Behavior: Behavior normal.        Thought Content: Thought content normal.        Judgment: Judgment normal.      No results found for any visits on 04/21/22.    The 10-year ASCVD risk score (Arnett DK, et al., 2019) is: 2%    Assessment & Plan:   Problem List Items Addressed This Visit   None Visit Diagnoses     Inattention       Relevant Medications   atomoxetine (STRATTERA) 10 MG capsule     Strattera 232mdoing well. Continue regimen. See back in December 2024 for physical as scheduled, sooner prn. Refilled Strattera today.   Return for next scheduled.    ALLeeanne RioMD

## 2022-04-22 ENCOUNTER — Ambulatory Visit: Payer: 59 | Admitting: Family Medicine

## 2022-04-29 ENCOUNTER — Ambulatory Visit: Payer: 59 | Admitting: Bariatrics

## 2022-05-27 ENCOUNTER — Other Ambulatory Visit: Payer: Self-pay | Admitting: Family Medicine

## 2022-05-27 DIAGNOSIS — R41840 Attention and concentration deficit: Secondary | ICD-10-CM

## 2022-05-31 ENCOUNTER — Other Ambulatory Visit: Payer: Self-pay | Admitting: Family Medicine

## 2022-05-31 DIAGNOSIS — I10 Essential (primary) hypertension: Secondary | ICD-10-CM

## 2022-09-04 ENCOUNTER — Other Ambulatory Visit: Payer: Self-pay | Admitting: Family Medicine

## 2022-09-04 DIAGNOSIS — I10 Essential (primary) hypertension: Secondary | ICD-10-CM

## 2022-10-02 ENCOUNTER — Other Ambulatory Visit: Payer: Self-pay | Admitting: Family Medicine

## 2022-10-02 DIAGNOSIS — R41840 Attention and concentration deficit: Secondary | ICD-10-CM

## 2022-12-23 ENCOUNTER — Other Ambulatory Visit: Payer: Self-pay | Admitting: Family Medicine

## 2022-12-23 DIAGNOSIS — R41840 Attention and concentration deficit: Secondary | ICD-10-CM

## 2023-02-23 ENCOUNTER — Encounter: Payer: Self-pay | Admitting: Family Medicine

## 2023-02-23 ENCOUNTER — Other Ambulatory Visit: Payer: Self-pay | Admitting: Family Medicine

## 2023-02-23 ENCOUNTER — Ambulatory Visit (INDEPENDENT_AMBULATORY_CARE_PROVIDER_SITE_OTHER): Payer: 59 | Admitting: Family Medicine

## 2023-02-23 VITALS — BP 138/82 | HR 66 | Temp 98.2°F | Resp 18 | Ht 63.0 in | Wt 226.9 lb

## 2023-02-23 DIAGNOSIS — Z1231 Encounter for screening mammogram for malignant neoplasm of breast: Secondary | ICD-10-CM

## 2023-02-23 DIAGNOSIS — Z0000 Encounter for general adult medical examination without abnormal findings: Secondary | ICD-10-CM | POA: Diagnosis not present

## 2023-02-23 DIAGNOSIS — R7302 Impaired glucose tolerance (oral): Secondary | ICD-10-CM | POA: Diagnosis not present

## 2023-02-23 DIAGNOSIS — Z136 Encounter for screening for cardiovascular disorders: Secondary | ICD-10-CM

## 2023-02-23 DIAGNOSIS — Z13220 Encounter for screening for lipoid disorders: Secondary | ICD-10-CM | POA: Diagnosis not present

## 2023-02-23 MED ORDER — FLUOXETINE HCL 20 MG PO CAPS: 20.0000 mg | ORAL_CAPSULE | Freq: Every day | ORAL | 1 refills | Status: DC

## 2023-02-23 NOTE — Progress Notes (Signed)
Complete physical exam  Patient: Rachael Young   DOB: 12-23-1970   52 y.o. Female  MRN: [id number]  Subjective:    Chief Complaint  Patient presents with   Annual Exam    Patient is fasting    Rachael Young is a 52 y.o. female who presents today for a complete physical exam. She reports consuming a general diet. The patient does not participate in regular exercise at present. She generally feels well. She reports sleeping well. She does not have additional problems to discuss today.    Most recent fall risk assessment:    02/21/2022    8:41 AM  Fall Risk   Falls in the past year? 0  Number falls in past yr: 0  Injury with Fall? 0  Follow up Falls evaluation completed     Most recent depression screenings:    01/20/2022    2:08 PM  PHQ 2/9 Scores  PHQ - 2 Score 2  PHQ- 9 Score 16    Vision:Within last year  Patient Active Problem List   Diagnosis Date Noted   Insulin resistance 03/17/2022   Pre-diabetes 03/17/2022   Class 3 severe obesity with serious comorbidity and body mass index (BMI) of 40.0 to 44.9 in adult (HCC) 03/17/2022   Other fatigue 03/03/2022   SOB (shortness of breath) on exertion 03/03/2022   Health care maintenance 03/03/2022   Prediabetes 03/03/2022   History of bariatric surgery 03/03/2022   Vitamin B 12 deficiency 03/03/2022   Vitamin D deficiency 01/20/2022   Mixed hyperlipidemia 01/20/2022   Seasonal allergies 05/21/2021   History of skull fracture 05/21/2021   History of degenerative disc disease 05/21/2021   Hearing loss 05/21/2021   Plantar fasciitis, right 12/22/2019   Bilateral sensorineural hearing loss 10/12/2015   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   Morbid obesity (HCC) 03/02/2012   Depression 03/02/2012   Essential hypertension 03/02/2012   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   Past Medical History:  Diagnosis Date   Anxiety    Back pain    Colon polyps    Depression    GAD (generalized anxiety  disorder)    Hyperlipemia    Hypertension    Vitamin D deficiency    Past Surgical History:  Procedure Laterality Date   ABLATION     uterine due to abnormal menses   CESAREAN SECTION     HEEL SPUR RESECTION Bilateral    LAPAROSCOPIC GASTRIC SLEEVE RESECTION     Social History   Socioeconomic History   Marital status: Married    Spouse name: Not on file   Number of children: 1   Years of education: Not on file   Highest education level: Not on file  Occupational History   Not on file  Tobacco Use   Smoking status: Never   Smokeless tobacco: Never  Vaping Use   Vaping status: Never Used  Substance and Sexual Activity   Alcohol use: Yes    Alcohol/week: 1.0 standard drink of alcohol    Types: 1 Glasses of wine per week    Comment: social drinker   Drug use: Never   Sexual activity: Not on file  Other Topics Concern   Not on file  Social History Narrative   Not on file   Social Drivers of Health   Financial Resource Strain: Not on file  Food Insecurity: Not on file  Transportation Needs: Not on file  Physical Activity: Not on file  Stress: Not on file  Social Connections: Unknown (07/07/2021)   Received from [facility]   Social Network    Social Network: Not on file  Intimate Partner Violence: Unknown (05/28/2021)   Received [facility]   HITS    Physically Hurt: Not on file    Insult or Talk Down To: Not on file    Threaten Physical Harm: Not on file    Scream or Curse: Not on file   Family History  Problem Relation Age of Onset   Hypertension Mother    Thyroid disease Mother    Depression Mother    Diabetes Mother    High Cholesterol Mother    Anxiety disorder Mother    Sleep apnea Mother    Hypertension Father    Coronary artery disease Father    Sleep apnea Father    High Cholesterol Father    Diabetes Father    Diabetes Maternal Grandmother    Skin cancer Paternal Grandmother    Colon cancer Paternal  Grandfather    Heart attack Paternal Grandfather    Allergies  Allergen Reactions   Diclofenac Sodium Cough    Denies knowledge of taking Voltaren in the past and having an allergy   Denies knowledge of taking Voltaren in the past and having an allergy   Lisinopril Cough    ACEI   Metoprolol Cough   Tape Rash    BLISTERS      Patient Care Team: Suzan Slick, MD as PCP - General (Family Medicine)   Outpatient Medications Prior to Visit  Medication Sig   acetaminophen (TYLENOL) 500 MG tablet Take 500 mg by mouth every 4 (four) hours as needed.   amLODipine (NORVASC) 10 MG tablet TAKE 1 TABLET BY MOUTH EVERY DAY   atomoxetine (STRATTERA) 10 MG capsule TAKE 2 CAPSULES BY MOUTH DAILY   atorvastatin (LIPITOR) 10 MG tablet Take 0.5 tablets (5 mg total) by mouth at bedtime.   Cyanocobalamin (VITAMIN B12 PO) Take by mouth.   FLUoxetine (PROZAC) 20 MG capsule Take 20 mg daily by mouth.   hydrochlorothiazide (HYDRODIURIL) 25 MG tablet TAKE 1 TABLET (25 MG TOTAL) BY MOUTH DAILY.   losartan (COZAAR) 25 MG tablet TAKE 1 TABLET (25 MG TOTAL) BY MOUTH DAILY.   Multiple Vitamin (MULTI VITAMIN PO) Take by mouth.   SEMAGLUTIDE-WEIGHT MANAGEMENT Nelson Inject 2.5 mLs into the skin. Reported by patient   Vitamin D, Ergocalciferol, (DRISDOL) 1.25 MG (50000 UNIT) CAPS capsule Take 1 capsule (50,000 Units total) by mouth every 7 (seven) days.   No facility-administered medications prior to visit.    Review of Systems  All other systems reviewed and are negative.        Objective:     BP 138/82   Pulse 66   Temp 98.2 F (36.8 C) (Oral)   Resp 18   Ht 5\' 3"  (1.6 m)   Wt 226 lb 14.4 oz (102.9 kg)   SpO2 97%   BMI 40.19 kg/m  BP Readings from Last 3 Encounters:  02/23/23 138/82  04/21/22 134/88  03/17/22 130/83      Physical Exam Vitals and nursing note reviewed.  Constitutional:      Appearance: Normal appearance. She is normal weight.  HENT:     Head: Normocephalic and  atraumatic.     Right Ear: Tympanic membrane, ear canal and external ear normal.     Left Ear: Tympanic membrane, ear canal and external ear normal.     Nose: Nose  normal.     Mouth/Throat:     Mouth: Mucous membranes are moist.     Pharynx: Oropharynx is clear.  Eyes:     Conjunctiva/sclera: Conjunctivae normal.     Pupils: Pupils are equal, round, and reactive to light.  Cardiovascular:     Rate and Rhythm: Normal rate and regular rhythm.     Pulses: Normal pulses.     Heart sounds: Normal heart sounds.  Pulmonary:     Effort: Pulmonary effort is normal.     Breath sounds: Normal breath sounds.  Abdominal:     General: Abdomen is flat. Bowel sounds are normal.  Skin:    General: Skin is warm.     Capillary Refill: Capillary refill takes less than 2 seconds.  Neurological:     General: No focal deficit present.     Mental Status: She is alert and oriented to person, place, and time. Mental status is at baseline.  Psychiatric:        Mood and Affect: Mood normal.        Behavior: Behavior normal.        Thought Content: Thought content normal.        Judgment: Judgment normal.     No results found for any visits on 02/23/23. Last CBC Lab Results  Component Value Date   WBC 8.1 02/21/2022   HGB 13.6 02/21/2022   HCT 41.7 02/21/2022   MCV 84 02/21/2022   MCH 27.3 02/21/2022   RDW 13.2 02/21/2022   PLT 417 02/21/2022   Last metabolic panel Lab Results  Component Value Date   GLUCOSE 92 02/21/2022   NA 141 02/21/2022   K 4.2 02/21/2022   CL 100 02/21/2022   CO2 26 02/21/2022   BUN 11 02/21/2022   CREATININE 0.78 02/21/2022   EGFR 93 02/21/2022   CALCIUM 9.4 02/21/2022   PROT 7.3 02/21/2022   ALBUMIN 4.5 02/21/2022   LABGLOB 2.8 02/21/2022   AGRATIO 1.6 02/21/2022   BILITOT 0.3 02/21/2022   ALKPHOS 69 02/21/2022   AST 16 02/21/2022   ALT 18 02/21/2022   Last lipids Lab Results  Component Value Date   CHOL 253 (H) 02/21/2022   HDL 62 02/21/2022    LDLCALC 167 (H) 02/21/2022   TRIG 133 02/21/2022   CHOLHDL 4.1 02/21/2022   Last hemoglobin A1c Lab Results  Component Value Date   HGBA1C 5.7 (H) 01/20/2022        Assessment & Plan:    Routine Health Maintenance and Physical Exam   There is no immunization history on file for this patient.  Health Maintenance  Topic Date Due   COVID-19 Vaccine (1) Never done   Zoster Vaccines- Shingrix (1 of 2) Never done   MAMMOGRAM  Never done   INFLUENZA VACCINE  Never done   Cervical Cancer Screening (HPV/Pap Cotest)  02/22/2027   Colonoscopy  06/01/2032   Hepatitis C Screening  Completed   HIV Screening  Completed   HPV VACCINES  Aged Out   DTaP/Tdap/Td  Discontinued    Discussed health benefits of physical activity, and encouraged her to engage in regular exercise appropriate for her age and condition.  Problem List Items Addressed This Visit   None  No follow-ups on file. Annual physical exam  Impaired glucose tolerance -     CBC with Differential/Platelet -     Comprehensive metabolic panel -     Hemoglobin A1c  Encounter for lipid screening for cardiovascular disease -  Lipid panel  Screening mammogram for breast cancer -     3D Screening Mammogram, Left and Right; Future   Screening labs Mammogram ordered. To return for pap, defers today.    Suzan Slick, MD

## 2023-02-24 LAB — CBC WITH DIFFERENTIAL/PLATELET
Basophils Absolute: 0.1 10*3/uL (ref 0.0–0.2)
Basos: 1 %
EOS (ABSOLUTE): 0.5 10*3/uL — ABNORMAL HIGH (ref 0.0–0.4)
Eos: 6 %
Hematocrit: 45.1 % (ref 34.0–46.6)
Hemoglobin: 14.5 g/dL (ref 11.1–15.9)
Immature Grans (Abs): 0 10*3/uL (ref 0.0–0.1)
Immature Granulocytes: 0 %
Lymphocytes Absolute: 2.4 10*3/uL (ref 0.7–3.1)
Lymphs: 32 %
MCH: 28.7 pg (ref 26.6–33.0)
MCHC: 32.2 g/dL (ref 31.5–35.7)
MCV: 89 fL (ref 79–97)
Monocytes Absolute: 0.5 10*3/uL (ref 0.1–0.9)
Monocytes: 6 %
Neutrophils Absolute: 4.1 10*3/uL (ref 1.4–7.0)
Neutrophils: 55 %
Platelets: 409 10*3/uL (ref 150–450)
RBC: 5.05 x10E6/uL (ref 3.77–5.28)
RDW: 13.1 % (ref 11.7–15.4)
WBC: 7.5 10*3/uL (ref 3.4–10.8)

## 2023-02-24 LAB — COMPREHENSIVE METABOLIC PANEL
ALT: 17 [IU]/L (ref 0–32)
AST: 18 [IU]/L (ref 0–40)
Albumin: 4.2 g/dL (ref 3.9–4.9)
Alkaline Phosphatase: 81 [IU]/L (ref 44–121)
BUN/Creatinine Ratio: 14 (ref 9–23)
BUN: 10 mg/dL (ref 6–24)
Bilirubin Total: 0.3 mg/dL (ref 0.0–1.2)
CO2: 26 mmol/L (ref 20–29)
Calcium: 9.4 mg/dL (ref 8.7–10.2)
Chloride: 102 mmol/L (ref 96–106)
Creatinine, Ser: 0.71 mg/dL (ref 0.57–1.00)
Globulin, Total: 3 g/dL (ref 1.5–4.5)
Glucose: 84 mg/dL (ref 70–99)
Potassium: 4.2 mmol/L (ref 3.5–5.2)
Sodium: 142 mmol/L (ref 134–144)
Total Protein: 7.2 g/dL (ref 6.0–8.5)
eGFR: 104 mL/min/{1.73_m2} (ref >59)

## 2023-02-24 LAB — HEMOGLOBIN A1C
Est. average glucose Bld gHb Est-mCnc: 117 mg/dL
Hgb A1c MFr Bld: 5.7 % — ABNORMAL HIGH (ref 4.8–5.6)

## 2023-02-24 LAB — LIPID PANEL
Chol/HDL Ratio: 3.9 {ratio} (ref 0.0–4.4)
Cholesterol, Total: 213 mg/dL — ABNORMAL HIGH (ref 100–199)
HDL: 55 mg/dL (ref >39)
LDL Chol Calc (NIH): 132 mg/dL — ABNORMAL HIGH (ref 0–99)
Triglycerides: 147 mg/dL (ref 0–149)
VLDL Cholesterol Cal: 26 mg/dL (ref 5–40)

## 2023-02-26 ENCOUNTER — Encounter: Payer: Self-pay | Admitting: Family Medicine

## 2023-02-26 ENCOUNTER — Ambulatory Visit (INDEPENDENT_AMBULATORY_CARE_PROVIDER_SITE_OTHER): Payer: 59 | Admitting: Family Medicine

## 2023-02-26 ENCOUNTER — Other Ambulatory Visit: Payer: Self-pay | Admitting: Family Medicine

## 2023-02-26 VITALS — BP 127/79 | HR 70 | Temp 97.7°F | Resp 18 | Ht 63.0 in | Wt 224.7 lb

## 2023-02-26 DIAGNOSIS — Z124 Encounter for screening for malignant neoplasm of cervix: Secondary | ICD-10-CM

## 2023-02-26 DIAGNOSIS — E782 Mixed hyperlipidemia: Secondary | ICD-10-CM

## 2023-02-26 NOTE — Progress Notes (Signed)
 Subjective:     Rachael Young is a 53 y.o. woman who comes in today for a  pap smear only. Her most recent annual exam was on 02/23/2023. Her most recent Pap smear was on 02/21/2022 and showed ASCUS with NEGATIVE high risk HPV. Previous abnormal Pap smears: no. Contraception: none  The following portions of the patient's history were reviewed and updated as appropriate: allergies, current medications, past family history, past medical history, past social history, past surgical history, and problem list.  Review of Systems Pertinent items are noted in HPI.   Objective:    BP 127/79   Pulse 70   Temp 97.7 F (36.5 C) (Oral)   Resp 18   Ht 5' 3 (1.6 m)   Wt 224 lb 11.2 oz (101.9 kg)   SpO2 97%   BMI 39.80 kg/m  Pelvic Exam: cervix normal in appearance, external genitalia normal, and vagina normal without discharge. Pap smear obtained. Chaperone present  Assessment:    Screening pap smear.  Screening for cervical cancer -     IGP, Aptima HPV    Plan:    Follow up in 1 year, or as indicated by Pap results.

## 2023-03-01 LAB — IGP, APTIMA HPV
HPV Aptima: NEGATIVE
PAP Smear Comment: 0

## 2023-03-03 ENCOUNTER — Encounter: Payer: Self-pay | Admitting: Family Medicine

## 2023-03-03 ENCOUNTER — Telehealth: Payer: 59 | Admitting: Family Medicine

## 2023-03-03 VITALS — Ht 63.0 in

## 2023-03-03 DIAGNOSIS — J0110 Acute frontal sinusitis, unspecified: Secondary | ICD-10-CM | POA: Diagnosis not present

## 2023-03-03 MED ORDER — METHYLPREDNISOLONE 4 MG PO TBPK: ORAL_TABLET | ORAL | 0 refills | Status: DC

## 2023-03-03 MED ORDER — AZITHROMYCIN 500 MG PO TABS: 500.0000 mg | ORAL_TABLET | Freq: Every day | ORAL | 0 refills | Status: DC

## 2023-03-03 NOTE — Progress Notes (Signed)
 Virtual Visit via Video Note  I connected with Rachael Young on 03/03/23 at  4:10 PM EST by a video enabled telemedicine application and verified that I am speaking with the correct person using two identifiers.  Location: Patient: home in [place], [place] Provider: Primary care office [place]    I discussed the limitations of evaluation and management by telemedicine and the availability of in person appointments. The patient expressed understanding and agreed to proceed.  History of Present Illness:   pt with 3 days of sinus pressure, nasal congestion and yellowish drainage, with ear pain. She denies fever. Has tried OTC medicines that hasn't helped. Husband was sick with similar symptoms a week ago.  Observations/Objective: Physical Exam Vitals and nursing note reviewed.  Constitutional:      Appearance: Normal appearance. She is normal weight.  HENT:     Head: Normocephalic and atraumatic.     Right Ear: External ear normal.     Left Ear: External ear normal.  Eyes:     Comments: Watery eyes  Pulmonary:     Effort: Pulmonary effort is normal.  Neurological:     General: No focal deficit present.     Mental Status: She is alert and oriented to person, place, and time. Mental status is at baseline.  Psychiatric:        Mood and Affect: Mood normal.        Behavior: Behavior normal.        Thought Content: Thought content normal.        Judgment: Judgment normal.     Assessment and Plan: Acute non-recurrent frontal sinusitis -     Azithromycin ; Take 1 tablet (500 mg total) by mouth daily.  Dispense: 3 tablet; Refill: 0 -     methylPREDNISolone ; 6-day pack as directed  Dispense: 21 tablet; Refill: 0     Follow Up Instructions: Treat for suspected sinusitis with Azithromycin  500mg  daily x 3 days and medrol  dose pack   I discussed the assessment and treatment plan with the patient. The patient was provided an opportunity to ask questions and all were answered. The  patient agreed with the plan and demonstrated an understanding of the instructions.   The patient was advised to call back or seek an in-person evaluation if the symptoms worsen or if the condition fails to improve as anticipated.  I provided 4 minutes of non-face-to-face time during this encounter.   Torrence CINDERELLA Barrier, MD

## 2023-03-26 ENCOUNTER — Encounter: Payer: Self-pay | Admitting: Family Medicine

## 2023-03-26 ENCOUNTER — Telehealth: Payer: 59 | Admitting: Family Medicine

## 2023-03-26 VITALS — Temp 101.5°F | Ht 63.0 in

## 2023-03-26 DIAGNOSIS — J111 Influenza due to unidentified influenza virus with other respiratory manifestations: Secondary | ICD-10-CM | POA: Diagnosis not present

## 2023-03-26 MED ORDER — OSELTAMIVIR PHOSPHATE 75 MG PO CAPS: 75.0000 mg | ORAL_CAPSULE | Freq: Two times a day (BID) | ORAL | 0 refills | Status: AC

## 2023-03-26 NOTE — Progress Notes (Signed)
Virtual Visit via Video Note  I connected with Rachael Young on 03/26/23 at  3:10 PM EST by a video enabled telemedicine application and verified that I am speaking with the correct person using two identifiers.  Location: Patient: home in [place] Provider: Primary care in [place]   I discussed the limitations of evaluation and management by telemedicine and the availability of in person appointments. The patient expressed understanding and agreed to proceed.  History of Present Illness: Pt reports her husband went on a business trip and returned last weekend. He felt ill upon his return so went to urgent care on Sunday. Diagnosed with influenza A. Pt now has symptoms of cough, fever, chills and body aches. Denies chest pains or SOB. Has tried Dayquil just today due to cough. Her symptoms started Wednesday night.   Observations/Objective: Physical Exam Vitals and nursing note reviewed.  Constitutional:      Appearance: She is normal weight. She is ill-appearing. She is not toxic-appearing.  HENT:     Head: Normocephalic and atraumatic.     Right Ear: External ear normal.     Left Ear: External ear normal.     Nose: Nose normal.  Pulmonary:     Effort: Pulmonary effort is normal.  Neurological:     General: No focal deficit present.     Mental Status: She is alert and oriented to person, place, and time. Mental status is at baseline.  Psychiatric:        Mood and Affect: Mood normal.        Behavior: Behavior normal.        Thought Content: Thought content normal.        Judgment: Judgment normal.     Assessment and Plan: Influenza -     Oseltamivir Phosphate; Take 1 capsule (75 mg total) by mouth 2 (two) times daily for 5 days.  Dispense: 10 capsule; Refill: 0     Follow Up Instructions:   symptoms consistent with Influenza A with close household contact with diagnosis on Sunday. Treat with Tamiflu 75mg  BID  x 5 days.  Advised on symptomatic treatment with  vicks rub, honey/tea, advil or aleve for fever. Drink plenty of fluids and staying hydrated.  I discussed the assessment and treatment plan with the patient. The patient was provided an opportunity to ask questions and all were answered. The patient agreed with the plan and demonstrated an understanding of the instructions.   The patient was advised to call back or seek an in-person evaluation if the symptoms worsen or if the condition fails to improve as anticipated.  I provided 4 minutes of non-face-to-face time during this encounter.   Suzan Slick, MD

## 2023-04-15 ENCOUNTER — Ambulatory Visit: Payer: 59

## 2023-04-15 DIAGNOSIS — Z1231 Encounter for screening mammogram for malignant neoplasm of breast: Secondary | ICD-10-CM | POA: Diagnosis not present

## 2023-04-21 ENCOUNTER — Encounter: Payer: Self-pay | Admitting: Family Medicine

## 2023-07-10 ENCOUNTER — Other Ambulatory Visit: Payer: Self-pay | Admitting: Family Medicine

## 2023-07-10 DIAGNOSIS — I10 Essential (primary) hypertension: Secondary | ICD-10-CM

## 2023-08-13 ENCOUNTER — Ambulatory Visit (INDEPENDENT_AMBULATORY_CARE_PROVIDER_SITE_OTHER): Payer: Self-pay | Admitting: Podiatry

## 2023-08-13 ENCOUNTER — Ambulatory Visit

## 2023-08-13 ENCOUNTER — Encounter: Payer: Self-pay | Admitting: Podiatry

## 2023-08-13 DIAGNOSIS — M19072 Primary osteoarthritis, left ankle and foot: Secondary | ICD-10-CM | POA: Diagnosis not present

## 2023-08-13 DIAGNOSIS — M7752 Other enthesopathy of left foot: Secondary | ICD-10-CM

## 2023-08-13 DIAGNOSIS — M79672 Pain in left foot: Secondary | ICD-10-CM

## 2023-08-13 DIAGNOSIS — M79671 Pain in right foot: Secondary | ICD-10-CM

## 2023-08-13 DIAGNOSIS — R0982 Postnasal drip: Secondary | ICD-10-CM | POA: Insufficient documentation

## 2023-08-13 DIAGNOSIS — M19071 Primary osteoarthritis, right ankle and foot: Secondary | ICD-10-CM

## 2023-08-13 DIAGNOSIS — M7751 Other enthesopathy of right foot: Secondary | ICD-10-CM

## 2023-08-13 MED ORDER — DEXAMETHASONE SODIUM PHOSPHATE 120 MG/30ML IJ SOLN
4.0000 mg | Freq: Once | INTRAMUSCULAR | Status: AC
  Administered 2023-08-13: 4 mg via INTRA_ARTICULAR

## 2023-08-13 MED ORDER — TRIAMCINOLONE ACETONIDE 10 MG/ML IJ SUSP
2.5000 mg | Freq: Once | INTRAMUSCULAR | Status: AC
  Administered 2023-08-13: 2.5 mg via INTRA_ARTICULAR

## 2023-08-13 NOTE — Progress Notes (Signed)
  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 female presents for concern as above . Denies any other pedal complaints. Denies n/v/f/c.   Past Medical History:  Diagnosis Date   Anxiety    Back pain    Colon polyps    Depression    GAD (generalized anxiety disorder)    Hyperlipemia    Hypertension    Vitamin D  deficiency     Objective:  Physical Exam: Vascular: DP/PT pulses 2/4 bilateral. CFT <3 seconds. Normal hair growth on digits. No edema.  Skin. No lacerations or abrasions bilateral feet.  Musculoskeletal: MMT 5/5 bilateral lower extremities in DF, PF, Inversion and Eversion. Deceased ROM in DF of ankle joint. Tender over second and third tarsometatarsal joints bilateral worse on left. Crepitus noted with ROM and tenderness on left to second and third rays.  Neurological: Sensation intact to light touch.   Assessment:   1. Capsulitis of metatarsophalangeal (MTP) joint of left foot   2. Capsulitis of metatarsophalangeal (MTP) joint of right foot      Plan:  Patient was evaluated and treated and all questions answered. Discussed midfoot arthritis with patient and treatment options.  X-rays reviewed. No acute fractures or dislocations. Degenerative changes noted throughout midfoot.  Discussed NSAIDS, topicals, and possible injections.  Injection offered today procedure below.  Patient would like to avoid oral medications.  Discussed stiff soled shoes and carbon fiber foot plate.  Discussed if pain does not improve can discuss surgical options.  Patient to  follow-up as needed.   Procedure: Injection Tendon/Ligament Discussed alternatives, risks, complications and verbal consent was obtained.  Location: Bilateral second and third tarsometatarsal joints . Skin Prep: Alcohol. Injectate: 1cc 0.5% marcaine plain, 1 cc dexamethasone 0.5 cc kenalog  Disposition: Patient tolerated procedure well. Injection site dressed with a band-aid.  Post-injection care was discussed and return precautions discussed.     Jennefer Moats, DPM

## 2023-08-21 ENCOUNTER — Ambulatory Visit (INDEPENDENT_AMBULATORY_CARE_PROVIDER_SITE_OTHER): Admitting: Family Medicine

## 2023-08-21 ENCOUNTER — Encounter: Payer: Self-pay | Admitting: Family Medicine

## 2023-08-21 VITALS — BP 113/63 | HR 60 | Temp 98.0°F | Resp 18 | Ht 63.0 in | Wt 230.4 lb

## 2023-08-21 DIAGNOSIS — S0300XA Dislocation of jaw, unspecified side, initial encounter: Secondary | ICD-10-CM

## 2023-08-21 NOTE — Progress Notes (Signed)
 Acute Office Visit  Subjective:     Patient ID: Rachael Young, female    DOB: 1970-09-14, 53 y.o.   MRN: [id number]  Chief Complaint  Patient presents with   Ear Pain    Patient states that she started having left ear pain on Tuesday, patient states that pain is now radiating down to her jaw    HPI Patient is in today for acute visit.  Pt reports left ear pain that started Tuesday when she woke up. She says the pain is in the left ear and feels like they radiate to her left jaw. Unsure if she grinds her teeth. Not chewing gum much. She also reports seeing a dentist a few weeks ago and she is needing a crown for the left lower molar. She did have numbing medicine at that time.    Review of Systems  HENT:  Positive for ear pain.        Left jaw pain  All other systems reviewed and are negative.      Objective:    BP 113/63   Pulse 60   Temp 98 F (36.7 C) (Oral)   Resp 18   Ht 5' 3 (1.6 m)   Wt 230 lb 6.4 oz (104.5 kg)   SpO2 96%   BMI 40.81 kg/m  BP Readings from Last 3 Encounters:  08/21/23 113/63  02/26/23 127/79  02/23/23 138/82      Physical Exam Vitals and nursing note reviewed.  Constitutional:      Appearance: Normal appearance. She is normal weight.  HENT:     Head: Normocephalic and atraumatic.     Right Ear: Tympanic membrane, ear canal and external ear normal.     Left Ear: Tympanic membrane, ear canal and external ear normal.     Nose: Nose normal.     Mouth/Throat:     Mouth: Mucous membranes are moist.     Pharynx: Oropharynx is clear.   Eyes:     Conjunctiva/sclera: Conjunctivae normal.     Pupils: Pupils are equal, round, and reactive to light.    Cardiovascular:     Rate and Rhythm: Normal rate.  Pulmonary:     Effort: Pulmonary effort is normal.  Abdominal:     General: Bowel sounds are normal.   Skin:    General: Skin is warm.     Capillary Refill: Capillary refill takes less than 2 seconds.   Neurological:     General:  No focal deficit present.     Mental Status: She is alert and oriented to person, place, and time. Mental status is at baseline.   Psychiatric:        Mood and Affect: Mood normal.        Behavior: Behavior normal.        Thought Content: Thought content normal.        Judgment: Judgment normal.   No results found for any visits on 08/21/23.      Assessment & Plan:   Problem List Items Addressed This Visit   None Visit Diagnoses       Dislocation of temporomandibular joint, initial encounter    -  Primary      Have advised pt that her condition is most consistent with TMJ. To rest the jaw. May use OTC Aleve bid prn with food.   No orders of the defined types were placed in this encounter.   Return in about 6 months (around 02/29/2024) for Annual Physical.  Torrence CINDERELLA Barrier, MD

## 2023-08-26 ENCOUNTER — Ambulatory Visit: Admitting: Family Medicine

## 2023-10-17 ENCOUNTER — Other Ambulatory Visit: Payer: Self-pay | Admitting: Family Medicine

## 2023-11-19 ENCOUNTER — Other Ambulatory Visit: Payer: Self-pay | Admitting: Family Medicine

## 2023-11-19 NOTE — Telephone Encounter (Signed)
 Copied from CRM [id number]. Topic: Clinical - Medication Refill >> Nov 19, 2023 11:12 AM Donee H wrote: Medication: FLUoxetine  (PROZAC ) 20 MG capsule  Has the patient contacted their pharmacy? No  This is the patient's preferred pharmacy:  [facility] [phone] - [place], [place] - [address] [place] [address] Phone: [phone] Fax: [phone]    Is this the correct pharmacy for this prescription? Yes If no, delete pharmacy and type the correct one.   Has the prescription been filled recently? No  Is the patient out of the medication? No, but have only two left  Has the patient been seen for an appointment in the last year OR does the patient have an upcoming appointment? Yes  Can we respond through MyChart? Yes  Agent: Please be advised that Rx refills may take up to 3 business days. We ask that you follow-up with your pharmacy.

## 2023-11-23 ENCOUNTER — Other Ambulatory Visit: Payer: Self-pay | Admitting: Family Medicine

## 2023-11-23 DIAGNOSIS — E782 Mixed hyperlipidemia: Secondary | ICD-10-CM

## 2023-11-23 DIAGNOSIS — I10 Essential (primary) hypertension: Secondary | ICD-10-CM

## 2023-11-23 MED ORDER — HYDROCHLOROTHIAZIDE 25 MG PO TABS: 25.0000 mg | ORAL_TABLET | Freq: Every day | ORAL | 1 refills | Status: AC

## 2023-11-23 MED ORDER — ATORVASTATIN CALCIUM 10 MG PO TABS: 10.0000 mg | ORAL_TABLET | Freq: Every day | ORAL | 3 refills | Status: AC

## 2023-11-23 NOTE — Telephone Encounter (Unsigned)
 Copied from CRM [id number]. Topic: Clinical - Medication Refill >> Nov 23, 2023 10:18 AM Drema MATSU wrote: Medication: hydrochlorothiazide  (HYDRODIURIL ) 25 MG tablet, losartan  (COZAAR ) 25 MG tablet, atorvastatin  (LIPITOR) 10 MG tablet  Has the patient contacted their pharmacy? Yes (Agent: If no, request that the patient contact the pharmacy for the refill. If patient does not wish to contact the pharmacy document the reason why and proceed with request.) no more refills (Agent: If yes, when and what did the pharmacy advise?)  This is the patient's preferred pharmacy:  [facility]   [address] [place],  [place] Is this the correct pharmacy for this prescription? Yes If no, delete pharmacy and type the correct one.   Has the prescription been filled recently? No  Is the patient out of the medication? N/A  Has the patient been seen for an appointment in the last year OR does the patient have an upcoming appointment? Yes  Can we respond through MyChart? Yes  Agent: Please be advised that Rx refills may take up to 3 business days. We ask that you follow-up with your pharmacy.

## 2023-11-24 ENCOUNTER — Other Ambulatory Visit: Payer: Self-pay | Admitting: Family Medicine

## 2023-11-24 DIAGNOSIS — E782 Mixed hyperlipidemia: Secondary | ICD-10-CM

## 2023-11-24 DIAGNOSIS — I10 Essential (primary) hypertension: Secondary | ICD-10-CM

## 2023-11-24 MED ORDER — LOSARTAN POTASSIUM 25 MG PO TABS
25.0000 mg | ORAL_TABLET | Freq: Every day | ORAL | 1 refills | Status: AC
Start: 2023-11-24 — End: ?

## 2023-11-24 MED ORDER — AMLODIPINE BESYLATE 10 MG PO TABS
10.0000 mg | ORAL_TABLET | Freq: Every day | ORAL | 1 refills | Status: AC
Start: 2023-11-24 — End: ?

## 2023-11-24 NOTE — Telephone Encounter (Signed)
 Copied from CRM [id number]. Topic: Clinical - Medication Refill >> Nov 24, 2023 10:27 AM Donna BRAVO wrote: Medication:   losartan  (COZAAR ) 25 MG tablet  atorvastatin  (LIPITOR) 10 MG tablet -Bruno spoke with patient yesterday and is [facility],   amLODipine  (NORVASC ) 10 MG tablet  hydrochlorothiazide  (HYDRODIURIL ) 25 MG tablet - -Bruno spoke with patient yesterday and is requesting [facility],    Has the patient contacted their pharmacy? Yes Pharmacy called in [place] (Pharmacy) [phone] [facility] # [id number]   This is the patient's preferred pharmacy:   [facility] # [id number] [address] [place], [place] [address] Phone [phone] Fax [phone]   Is this the correct pharmacy for this prescription? Yes If no, delete pharmacy and type the correct one.   Has the prescription been filled recently? Yes  Is the patient out of the medication? No//Unknown  Has the patient been seen for an appointment in the last year OR does the patient have an upcoming appointment? Yes  Can we respond through MyChart? No//unknown  Agent: Please be advised that Rx refills may take up to 3 business days. We ask that you follow-up with your pharmacy.

## 2024-02-22 ENCOUNTER — Encounter: Admitting: Family Medicine

## 2024-03-04 ENCOUNTER — Encounter: Admitting: Family Medicine
